# Patient Record
Sex: Male | Born: 1950 | Race: Black or African American | Hispanic: No | Marital: Married | Smoking: Never smoker
Health system: Southern US, Community
[De-identification: ages and names within clinical notes are randomized; demographics above are authoritative.]

## PROBLEM LIST (undated history)

## (undated) DIAGNOSIS — M5030 Other cervical disc degeneration, unspecified cervical region: Secondary | ICD-10-CM

## (undated) DIAGNOSIS — I10 Essential (primary) hypertension: Secondary | ICD-10-CM

## (undated) DIAGNOSIS — M5432 Sciatica, left side: Secondary | ICD-10-CM

## (undated) DIAGNOSIS — Z800 Family history of malignant neoplasm of digestive organs: Secondary | ICD-10-CM

## (undated) DIAGNOSIS — N281 Cyst of kidney, acquired: Secondary | ICD-10-CM

## (undated) DIAGNOSIS — E785 Hyperlipidemia, unspecified: Secondary | ICD-10-CM

## (undated) DIAGNOSIS — Z86010 Personal history of colonic polyps: Secondary | ICD-10-CM

## (undated) DIAGNOSIS — Z8042 Family history of malignant neoplasm of prostate: Secondary | ICD-10-CM

## (undated) DIAGNOSIS — J301 Allergic rhinitis due to pollen: Secondary | ICD-10-CM

## (undated) DIAGNOSIS — K635 Polyp of colon: Secondary | ICD-10-CM

## (undated) DIAGNOSIS — I700 Atherosclerosis of aorta: Secondary | ICD-10-CM

## (undated) DIAGNOSIS — R202 Paresthesia of skin: Secondary | ICD-10-CM

## (undated) HISTORY — DX: Polyp of colon: K63.5

## (undated) HISTORY — DX: Family history of malignant neoplasm of digestive organs: Z80.0

## (undated) HISTORY — DX: Sciatica, left side: M54.32

## (undated) HISTORY — DX: Other cervical disc degeneration, unspecified cervical region: M50.30

## (undated) HISTORY — DX: Atherosclerosis of aorta: I70.0

## (undated) HISTORY — DX: Personal history of colonic polyps: Z86.010

## (undated) HISTORY — DX: Cyst of kidney, acquired: N28.1

## (undated) HISTORY — DX: Family history of malignant neoplasm of prostate: Z80.42

## (undated) HISTORY — PX: KNEE ARTHROSCOPY: SHX127

## (undated) HISTORY — DX: Allergic rhinitis due to pollen: J30.1

## (undated) HISTORY — DX: Hypercalcemia: E83.52

## (undated) HISTORY — DX: Paresthesia of skin: R20.2

---

## 2005-07-30 ENCOUNTER — Ambulatory Visit (HOSPITAL_COMMUNITY): Admission: RE | Admit: 2005-07-30 | Discharge: 2005-07-30 | Payer: Self-pay | Admitting: Orthopedic Surgery

## 2005-07-30 ENCOUNTER — Ambulatory Visit (HOSPITAL_BASED_OUTPATIENT_CLINIC_OR_DEPARTMENT_OTHER): Admission: RE | Admit: 2005-07-30 | Discharge: 2005-07-30 | Payer: Self-pay | Admitting: Orthopedic Surgery

## 2011-11-28 ENCOUNTER — Emergency Department (INDEPENDENT_AMBULATORY_CARE_PROVIDER_SITE_OTHER)
Admission: EM | Admit: 2011-11-28 | Discharge: 2011-11-28 | Disposition: A | Discharge status: Home / Self Care | Payer: Medicare Other | Source: Home / Self Care | Attending: Family Medicine | Admitting: Family Medicine

## 2011-11-28 ENCOUNTER — Encounter: Payer: Self-pay | Admitting: Cardiology

## 2011-11-28 DIAGNOSIS — J0100 Acute maxillary sinusitis, unspecified: Secondary | ICD-10-CM

## 2011-11-28 HISTORY — DX: Essential (primary) hypertension: I10

## 2011-11-28 MED ORDER — AMOXICILLIN-POT CLAVULANATE 875-125 MG PO TABS: 1.0000 | ORAL_TABLET | Freq: Two times a day (BID) | ORAL | Status: AC

## 2011-11-28 MED ORDER — IPRATROPIUM BROMIDE 0.06 % NA SOLN: 2.0000 | Freq: Four times a day (QID) | NASAL | Status: DC

## 2011-11-28 NOTE — ED Notes (Addendum)
Pt reports last 2-3 days of sinus pressure and face pain. Cough at night coughing up blood tinged sputum. Pt has cold like symptom for 6 days prior. Denies fever. Unable to sleep at night with the symptoms. Has tried tylenol sinus and coricidin for cold like symptoms no longer getting relief.

## 2011-11-28 NOTE — ED Provider Notes (Signed)
History     CSN: [id number] Arrival date & time: 11/28/2011 10:23 AM   First MD Initiated Contact with Patient 11/28/11 1013      Chief Complaint  Patient presents with  . Sinusitis  . Facial Pain  . Cough    (Consider location/radiation/quality/duration/timing/severity/associated sxs/prior treatment) Patient is a 60 y.o. male presenting with sinusitis and cough. The history is provided by the patient.  Sinusitis  This is a new problem. Episode onset: uri cold last week , now 2-3 days of facial pressure and tenderness., drainage, no fever. The problem has been gradually worsening. There has been no fever. The pain is mild. Associated symptoms include congestion, sinus pressure and cough. He has tried nothing for the symptoms.  Cough Associated symptoms include rhinorrhea.    Past Medical History  Diagnosis Date  . Hypertension     History reviewed. No pertinent past surgical history.  Family History  Problem Relation Age of Onset  . Hypertension Other     History  Substance Use Topics  . Smoking status: Never Smoker   . Smokeless tobacco: Not on file  . Alcohol Use: Yes     1 drink daily      Review of Systems  Constitutional: Negative.   HENT: Positive for nosebleeds, congestion, rhinorrhea, postnasal drip and sinus pressure.   Respiratory: Positive for cough.     Allergies  Food  Home Medications   Current Outpatient Rx  Name Route Sig Dispense Refill  . ATORVASTATIN CALCIUM 10 MG PO TABS Oral Take 10 mg by mouth daily.      Marland Kitchen PROPECIA PO Oral Take by mouth daily.      Marland Kitchen LISINOPRIL 10 MG PO TABS Oral Take 10 mg by mouth daily.      . AMOXICILLIN-POT CLAVULANATE 875-125 MG PO TABS Oral Take 1 tablet by mouth 2 (two) times daily. 20 tablet 0  . IPRATROPIUM BROMIDE 0.06 % NA SOLN Nasal Place 2 sprays into the nose 4 (four) times daily. 15 mL 12    BP 127/85  Pulse 94  Temp(Src) 98.1 F (36.7 C) (Oral)  Resp 18  SpO2 98%  Physical Exam  Nursing  note and vitals reviewed. Constitutional: He appears well-developed and well-nourished.  HENT:  Head: Normocephalic.  Right Ear: External ear normal.  Left Ear: External ear normal.  Nose: Mucosal edema and rhinorrhea present. Right sinus exhibits maxillary sinus tenderness. Left sinus exhibits maxillary sinus tenderness.  Mouth/Throat: Oropharynx is clear and moist.  Eyes: Conjunctivae and EOM are normal. Pupils are equal, round, and reactive to light.  Neck: Normal range of motion. Neck supple.  Cardiovascular: Normal rate, normal heart sounds and intact distal pulses.   Pulmonary/Chest: Effort normal.  Skin: Skin is warm and dry.    ED Course  Procedures (including critical care time)  Labs Reviewed - No data to display No results found.   1. Sinusitis, acute maxillary       MDM          Barkley Bruns, MD 11/28/11 1048

## 2015-02-09 ENCOUNTER — Emergency Department (HOSPITAL_COMMUNITY)
Admission: EM | Admit: 2015-02-09 | Discharge: 2015-02-09 | Disposition: A | Discharge status: Home / Self Care | Payer: BLUE CROSS/BLUE SHIELD | Attending: Emergency Medicine | Admitting: Emergency Medicine

## 2015-02-09 ENCOUNTER — Encounter (HOSPITAL_COMMUNITY): Payer: Self-pay

## 2015-02-09 DIAGNOSIS — Y998 Other external cause status: Secondary | ICD-10-CM | POA: Diagnosis not present

## 2015-02-09 DIAGNOSIS — R55 Syncope and collapse: Secondary | ICD-10-CM | POA: Insufficient documentation

## 2015-02-09 DIAGNOSIS — E785 Hyperlipidemia, unspecified: Secondary | ICD-10-CM | POA: Diagnosis not present

## 2015-02-09 DIAGNOSIS — R197 Diarrhea, unspecified: Secondary | ICD-10-CM

## 2015-02-09 DIAGNOSIS — Y9389 Activity, other specified: Secondary | ICD-10-CM | POA: Insufficient documentation

## 2015-02-09 DIAGNOSIS — S0081XA Abrasion of other part of head, initial encounter: Secondary | ICD-10-CM | POA: Diagnosis not present

## 2015-02-09 DIAGNOSIS — R109 Unspecified abdominal pain: Secondary | ICD-10-CM | POA: Diagnosis not present

## 2015-02-09 DIAGNOSIS — Z79899 Other long term (current) drug therapy: Secondary | ICD-10-CM | POA: Diagnosis not present

## 2015-02-09 DIAGNOSIS — Y92092 Bedroom in other non-institutional residence as the place of occurrence of the external cause: Secondary | ICD-10-CM | POA: Insufficient documentation

## 2015-02-09 DIAGNOSIS — W228XXA Striking against or struck by other objects, initial encounter: Secondary | ICD-10-CM | POA: Insufficient documentation

## 2015-02-09 DIAGNOSIS — R112 Nausea with vomiting, unspecified: Secondary | ICD-10-CM | POA: Diagnosis not present

## 2015-02-09 DIAGNOSIS — I10 Essential (primary) hypertension: Secondary | ICD-10-CM | POA: Insufficient documentation

## 2015-02-09 HISTORY — DX: Hyperlipidemia, unspecified: E78.5

## 2015-02-09 LAB — COMPREHENSIVE METABOLIC PANEL
ALK PHOS: 64 U/L (ref 39–117)
ALT: 23 U/L (ref 0–53)
ANION GAP: 3 — AB (ref 5–15)
AST: 28 U/L (ref 0–37)
Albumin: 4.2 g/dL (ref 3.5–5.2)
BUN: 11 mg/dL (ref 6–23)
CO2: 28 mmol/L (ref 19–32)
CREATININE: 0.95 mg/dL (ref 0.50–1.35)
Calcium: 9.2 mg/dL (ref 8.4–10.5)
Chloride: 107 mmol/L (ref 96–112)
GFR calc Af Amer: >90 mL/min (ref >90)
GFR calc non Af Amer: 86 mL/min — ABNORMAL LOW (ref >90)
Glucose, Bld: 120 mg/dL — ABNORMAL HIGH (ref 70–99)
POTASSIUM: 3.8 mmol/L (ref 3.5–5.1)
SODIUM: 138 mmol/L (ref 135–145)
Total Bilirubin: 0.6 mg/dL (ref 0.3–1.2)
Total Protein: 7.2 g/dL (ref 6.0–8.3)

## 2015-02-09 LAB — CBG MONITORING, ED: GLUCOSE-CAPILLARY: 107 mg/dL — AB (ref 70–99)

## 2015-02-09 LAB — CBC WITH DIFFERENTIAL/PLATELET
BASOS PCT: 0 % (ref 0–1)
Basophils Absolute: 0 10*3/uL (ref 0.0–0.1)
EOS ABS: 0 10*3/uL (ref 0.0–0.7)
EOS PCT: 0 % (ref 0–5)
HEMATOCRIT: 42.8 % (ref 39.0–52.0)
Hemoglobin: 14.7 g/dL (ref 13.0–17.0)
LYMPHS ABS: 0.6 10*3/uL — AB (ref 0.7–4.0)
Lymphocytes Relative: 6 % — ABNORMAL LOW (ref 12–46)
MCH: 31 pg (ref 26.0–34.0)
MCHC: 34.3 g/dL (ref 30.0–36.0)
MCV: 90.3 fL (ref 78.0–100.0)
Monocytes Absolute: 0.5 10*3/uL (ref 0.1–1.0)
Monocytes Relative: 5 % (ref 3–12)
Neutro Abs: 9 10*3/uL — ABNORMAL HIGH (ref 1.7–7.7)
Neutrophils Relative %: 89 % — ABNORMAL HIGH (ref 43–77)
PLATELETS: 208 10*3/uL (ref 150–400)
RBC: 4.74 MIL/uL (ref 4.22–5.81)
RDW: 14.1 % (ref 11.5–15.5)
WBC: 10.2 10*3/uL (ref 4.0–10.5)

## 2015-02-09 LAB — URINALYSIS, ROUTINE W REFLEX MICROSCOPIC
BILIRUBIN URINE: NEGATIVE
GLUCOSE, UA: NEGATIVE mg/dL
HGB URINE DIPSTICK: NEGATIVE
Ketones, ur: NEGATIVE mg/dL
Leukocytes, UA: NEGATIVE
NITRITE: NEGATIVE
Protein, ur: NEGATIVE mg/dL
Specific Gravity, Urine: 1.025 (ref 1.005–1.030)
UROBILINOGEN UA: 0.2 mg/dL (ref 0.0–1.0)
pH: 5.5 (ref 5.0–8.0)

## 2015-02-09 LAB — LIPASE, BLOOD: LIPASE: 22 U/L (ref 11–59)

## 2015-02-09 LAB — I-STAT TROPONIN, ED: Troponin i, poc: 0 ng/mL (ref 0.00–0.08)

## 2015-02-09 MED ORDER — ONDANSETRON HCL 4 MG PO TABS: 4.0000 mg | ORAL_TABLET | Freq: Four times a day (QID) | ORAL | Status: DC

## 2015-02-09 MED ORDER — SODIUM CHLORIDE 0.9 % IV BOLUS (SEPSIS): 1000.0000 mL | Freq: Once | INTRAVENOUS | Status: DC

## 2015-02-09 MED ORDER — ONDANSETRON HCL 4 MG/2ML IJ SOLN
4.0000 mg | INTRAMUSCULAR | Status: AC
  Administered 2015-02-09: 4 mg via INTRAVENOUS
  Filled 2015-02-09: qty 2

## 2015-02-09 MED ORDER — SODIUM CHLORIDE 0.9 % IV BOLUS (SEPSIS)
1000.0000 mL | INTRAVENOUS | Status: AC
  Administered 2015-02-09: 1000 mL via INTRAVENOUS

## 2015-02-09 NOTE — ED Provider Notes (Signed)
CSN: [id number]     Arrival date & time 02/09/15  0605 History   First MD Initiated Contact with Patient 02/09/15 [phone]     Chief Complaint  Patient presents with  . Loss of Consciousness   (Consider location/radiation/quality/duration/timing/severity/associated sxs/prior Treatment) HPI   Gerald Lynch is a 64 yo male presenting with report of abd pain, diarrhea and syncopal episode.  He states he and his wife had dinner of salmon salads around 5 pm last night.  Two hours later he states he felt some abd bloating and felt hot and cold and to lay down.  He began having loose brown stools followed by feeling cold and clammy and light-headed. His wife reports he called for her and he was sitting on the floor of the bedroom.  She was helping him to the bed and he became weak and light-headed and did not respond to her for several seconds but not longer than a minute.  He then resumed his baseline mental status and she assisted him to the bathroom to have another bowel movement.  While assisting him back, he became light-headed and sweaty again and passed out into the bathroom wall, hitting his forehead.  He quickly resumed baseline mental status and laid down in the bed.  He recalls three episodes of diarrhea and one episode of vomiting. He denies any chest pain or shortness of breath at any time.  He denies any focal numbness, weakness or blurred vison at any time. He denies bloody or bilious emesis or any tarry or bloody stools.      Past Medical History  Diagnosis Date  . Hypertension   . Hyperlipidemia    Past Surgical History  Procedure Laterality Date  . Knee arthroscopy     Family History  Problem Relation Age of Onset  . Hypertension Other    History  Substance Use Topics  . Smoking status: Never Smoker   . Smokeless tobacco: Not on file  . Alcohol Use: Yes     Comment: 1 drink daily    Review of Systems  Constitutional: Negative for fever and chills.  HENT: Negative for sore  throat.   Eyes: Negative for visual disturbance.  Respiratory: Negative for cough and shortness of breath.   Cardiovascular: Negative for chest pain and leg swelling.  Gastrointestinal: Positive for nausea, vomiting, abdominal pain and diarrhea. Negative for blood in stool.  Genitourinary: Negative for dysuria.  Musculoskeletal: Negative for myalgias.  Skin: Negative for rash.  Neurological: Negative for weakness, numbness and headaches.    Allergies  Food  Home Medications   Prior to Admission medications   Medication Sig Start Date End Date Taking? Authorizing Provider  atorvastatin (LIPITOR) 10 MG tablet Take 10 mg by mouth daily.      Historical Provider, MD  Finasteride (PROPECIA PO) Take by mouth daily.      Historical Provider, MD  ipratropium (ATROVENT) 0.06 % nasal spray Place 2 sprays into the nose 4 (four) times daily. 11/28/11 11/27/12  Billy Fischer, MD  lisinopril (PRINIVIL,ZESTRIL) 10 MG tablet Take 10 mg by mouth daily.      Historical Provider, MD   BP 142/93 mmHg  Pulse 94  Temp(Src) 98.3 F (36.8 C) (Oral)  Resp 15  Ht 6' (1.829 m)  Wt 193 lb (87.544 kg)  BMI 26.17 kg/m2  SpO2 99% Physical Exam  Constitutional: He is oriented to person, place, and time. He appears well-developed and well-nourished. No distress.  HENT:  Head: Normocephalic and  atraumatic.  Mouth/Throat: Oropharynx is clear and moist.  Superficial abrasion, to left forehead, no redness, swelling, or ecchymosis noted.   Eyes: Conjunctivae are normal. Pupils are equal, round, and reactive to light.  Neck: Neck supple.  Cardiovascular: Normal rate, regular rhythm and intact distal pulses.   Pulmonary/Chest: Effort normal and breath sounds normal. No respiratory distress. He has no wheezes. He has no rales. He exhibits no tenderness.  Abdominal: Soft. He exhibits no distension and no mass. There is no hepatosplenomegaly. There is no tenderness. There is no rigidity, no rebound, no guarding, no CVA  tenderness, no tenderness at McBurney's point and negative Murphy's sign.  Musculoskeletal: He exhibits no tenderness.  Lymphadenopathy:    He has no cervical adenopathy.  Neurological: He is alert and oriented to person, place, and time. He has normal strength. No cranial nerve deficit or sensory deficit. GCS eye subscore is 4. GCS verbal subscore is 5. GCS motor subscore is 6.  Cranial nerves 2-12 intact.   Skin: Skin is warm and dry. No rash noted. He is not diaphoretic.  Psychiatric: He has a normal mood and affect.  Nursing note and vitals reviewed.   ED Course  Procedures (including critical care time) Labs Review Labs Reviewed  CBC WITH DIFFERENTIAL/PLATELET - Abnormal; Notable for the following:    Neutrophils Relative % 89 (*)    Neutro Abs 9.0 (*)    Lymphocytes Relative 6 (*)    Lymphs Abs 0.6 (*)    All other components within normal limits  COMPREHENSIVE METABOLIC PANEL - Abnormal; Notable for the following:    Glucose, Bld 120 (*)    GFR calc non Af Amer 86 (*)    Anion gap 3 (*)    All other components within normal limits  CBG MONITORING, ED - Abnormal; Notable for the following:    Glucose-Capillary 107 (*)    All other components within normal limits  LIPASE, BLOOD  URINALYSIS, ROUTINE W REFLEX MICROSCOPIC  I-STAT TROPOININ, ED    Imaging Review No results found.   EKG Interpretation   Date/Time:  Sunday February 09 2015 06:09:43 EST Ventricular Rate:  94 PR Interval:  195 QRS Duration: 94 QT Interval:  348 QTC Calculation: 435 R Axis:   21 Text Interpretation:  Sinus rhythm Artifact No significant change since  last tracing Confirmed by WARD,  DO, KRISTEN ([id number]) on 02/09/2015 6:27:35  AM      MDM   Final diagnoses:  Vasovagal syncope  Nausea, vomiting and diarrhea   64 yo male presenting with abd discomfort, n/v/d and syncopal episode.  Consider viral gastroenteritis vs related to recent food intake.  No indication for cardiac or  significant neuro etiology for syncopal episodes.  Discussed case with Dr. Leonides Schanz.  NS bolus, CBC, CMP, Lipase, Troponin, EKG.  Re-check orthostatics showed minmal changes in heart rate and blood pressure with position change.  He was able to ambulate in ED and tolerate PO fluids.  Discussed oral rehydration at home and symptom management.  Pt is well-appearing, in no acute distress and vital signs reviewed and not concerning.  He appears safe to be discharged.  Discharge include follow-up with their PCP.  Return precautions provided. Pt aware of plan and in agreement.       Filed Vitals:   02/09/15 0612 02/09/15 0630 02/09/15 0645 02/09/15 0807  BP: 142/93 133/81 142/97 132/60  Pulse: 94 91 89 98  Temp: 98.3 F (36.8 C)     TempSrc: Oral  Resp: 15 20 20 16   Height: 6' (1.829 m)     Weight: 193 lb (87.544 kg)     SpO2: 99% 99% 100% 100%   Meds given in ED:  Medications  sodium chloride 0.9 % bolus 1,000 mL (0 mLs Intravenous Stopped 02/09/15 0719)  ondansetron (ZOFRAN) injection 4 mg (4 mg Intravenous Given 02/09/15 0641)  sodium chloride 0.9 % bolus 1,000 mL (0 mLs Intravenous Stopped 02/09/15 0806)    New Prescriptions   ONDANSETRON (ZOFRAN) 4 MG TABLET    Take 1 tablet (4 mg total) by mouth every 6 (six) hours.       Britt Bottom, NP 02/09/15 [phone]

## 2015-02-09 NOTE — ED Notes (Signed)
Pt denies chest pain at this time   Orthostatic vital signs per [facility]:  BP 152/100 HR 100  - lying BP 144/100 HR 100 - sitting  BP 130/92 HR 110 - standing

## 2015-02-09 NOTE — ED Notes (Signed)
Ambulated in hall with no assist--- denies dizziness, no diaphoresis.

## 2015-02-09 NOTE — ED Notes (Signed)
Yellow socks and falls band applied to pt.  

## 2015-02-09 NOTE — ED Notes (Signed)
Per [facility]: pt had salmon last night, pts wife had abdominal cramping, and the pt had diarrhea and 3 full syncopal episodes.

## 2015-02-09 NOTE — Discharge Instructions (Signed)
Please follow the directions provided.  Be sure to follow-up with your primary care provider to ensure you are getting better.  Continue to drink plenty of fluids by mouth.  You may take the zofran, if needed, for nausea to help you tolerate fluids.  You may progress to your normal diet as you can tolerate.  Your labs are all reassuring and these symptoms should resolve in 24 hours -48 hours. Don't hesitate to return for any new, worsening or concerning symptoms.      SEEK IMMEDIATE MEDICAL CARE IF:  You are unable to keep fluids down.  You have persistent vomiting.  You have blood in your stool, or your stools are black and tarry.  You do not urinate in 6-8 hours, or there is only a small amount of very dark urine.  You have abdominal pain that increases or localizes.  You have weakness, dizziness, confusion, or light-headedness.  You have a severe headache.  Your diarrhea gets worse or does not get better.  You have a fever or persistent symptoms for more than 2-3 days.  You have a fever and your symptoms suddenly get worse.

## 2015-02-09 NOTE — ED Provider Notes (Signed)
Medical screening examination/treatment/procedure(s) were conducted as a shared visit with non-physician practitioner(s) and myself.  I personally evaluated the patient during the encounter.   EKG Interpretation   Date/Time:  Sunday February 09 2015 06:09:43 EST Ventricular Rate:  94 PR Interval:  195 QRS Duration: 94 QT Interval:  348 QTC Calculation: 435 R Axis:   21 Text Interpretation:  Sinus rhythm Artifact No significant change since  last tracing Confirmed by Viliami Bracco,  DO, Isaak Delmundo [phone]) on 02/09/2015 6:27:35  AM      Pt is a 64 y.o. male with history of hypertension and hyperlipidemia who had 3 syncopal events at home with vomiting, diarrhea. Both patient and wife report eating salmon for dinner and both of them did not feel well afterwards. Wife did not have any vomiting or diarrhea the patient has had one episode of vomiting and multiple episodes of nonbloody diarrhea. No abdominal pain on exam. Denies chest pain or shortness of breath or palpitations. No headache, numbness, tingling or focal weakness. States he did hit his head against a wall but is not on anticoagulation and is currently neurologically intact. He is tachycardic with standing and become symptomatic. Suspect episodes of syncope related to GI symptoms. GI symptoms likely caused by viral gastroenteritis versus bad food exposure. Will continue IV hydration. We'll check basic labs and troponin. If workup unremarkable and he is feeling better, tolerating by mouth we'll discharge home.   [place], DO 02/09/15 [phone]

## 2015-02-09 NOTE — ED Notes (Addendum)
Pt has had 3 syncopal episodes 1st episode - unwitnessed, wife found pt on floor in bedroom.  2nd episode - wife helped the pt to the floor - pt did not hit head - took the pt less than 1 minute to become conscious  3rd episode - pt hit head on the wall - less than 1 minute to regain consciousness  Pt took imodium at home around 0300  Pt took tylenol at home around 0400   pts wife reports that the pt felt hot to the touch and would then become clammy and sweaty, she believes that the pt had a fever but was unable to take the pts temperature.

## 2015-02-09 NOTE — ED Notes (Signed)
Blood sugar 107

## 2015-04-08 ENCOUNTER — Encounter: Payer: Self-pay | Admitting: Podiatry

## 2015-04-08 ENCOUNTER — Ambulatory Visit (INDEPENDENT_AMBULATORY_CARE_PROVIDER_SITE_OTHER): Payer: BLUE CROSS/BLUE SHIELD | Admitting: Podiatry

## 2015-04-08 ENCOUNTER — Ambulatory Visit (INDEPENDENT_AMBULATORY_CARE_PROVIDER_SITE_OTHER): Payer: BLUE CROSS/BLUE SHIELD

## 2015-04-08 VITALS — BP 127/75 | HR 70 | Resp 16 | Ht 72.0 in | Wt 187.0 lb

## 2015-04-08 DIAGNOSIS — M2011 Hallux valgus (acquired), right foot: Secondary | ICD-10-CM

## 2015-04-08 DIAGNOSIS — M205X1 Other deformities of toe(s) (acquired), right foot: Secondary | ICD-10-CM | POA: Diagnosis not present

## 2015-04-08 NOTE — Progress Notes (Signed)
   Subjective:    Patient ID: Gerald Lynch, male    DOB: 1951-04-08, 64 y.o.   MRN: [id number]  HPI Comments: "I have a bunion I want checked"  Patient c/o aching 1st MPJ right for several years. Getting worse. Gets red and swollen occasionally. Takes Advil as needed.     Review of Systems  Allergic/Immunologic: Positive for food allergies.  All other systems reviewed and are negative.      Objective:   Physical Exam: I have reviewed his past medical history medications allergies surgery social history and review of systems. Pulses are strongly palpable bilateral. Deep tendon reflexes are intact bilaterally neurologic sensorium is intact. Muscle strength +5 over 5 dorsiflexion plantar flexors and inverters everters all intrinsic musculature is intact. Orthopedic evaluation does demonstrate mild pes planus hallux abductovalgus deformity with hallux limitus first metatarsophalangeal joint right greater than left. Radiographic evaluation confirms this. No signs of severe osteoarthritic changes.        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  Plan: Discussed the etiology pathology conservative versus surgical therapies. At this point time he will follow up with [place] on an as-needed basis for surgical intervention.

## 2015-09-24 DIAGNOSIS — H43391 Other vitreous opacities, right eye: Secondary | ICD-10-CM | POA: Diagnosis not present

## 2015-09-26 DIAGNOSIS — E7800 Pure hypercholesterolemia, unspecified: Secondary | ICD-10-CM | POA: Diagnosis not present

## 2015-09-26 DIAGNOSIS — Z125 Encounter for screening for malignant neoplasm of prostate: Secondary | ICD-10-CM | POA: Diagnosis not present

## 2015-09-26 DIAGNOSIS — Z1389 Encounter for screening for other disorder: Secondary | ICD-10-CM | POA: Diagnosis not present

## 2015-09-26 DIAGNOSIS — Z23 Encounter for immunization: Secondary | ICD-10-CM | POA: Diagnosis not present

## 2015-09-26 DIAGNOSIS — I10 Essential (primary) hypertension: Secondary | ICD-10-CM | POA: Diagnosis not present

## 2015-11-19 DIAGNOSIS — H43811 Vitreous degeneration, right eye: Secondary | ICD-10-CM | POA: Diagnosis not present

## 2015-11-19 DIAGNOSIS — H2513 Age-related nuclear cataract, bilateral: Secondary | ICD-10-CM | POA: Diagnosis not present

## 2016-03-22 DIAGNOSIS — H43811 Vitreous degeneration, right eye: Secondary | ICD-10-CM | POA: Diagnosis not present

## 2016-03-22 DIAGNOSIS — H2513 Age-related nuclear cataract, bilateral: Secondary | ICD-10-CM | POA: Diagnosis not present

## 2016-03-26 DIAGNOSIS — E876 Hypokalemia: Secondary | ICD-10-CM | POA: Diagnosis not present

## 2016-03-26 DIAGNOSIS — I10 Essential (primary) hypertension: Secondary | ICD-10-CM | POA: Diagnosis not present

## 2016-03-26 DIAGNOSIS — Z23 Encounter for immunization: Secondary | ICD-10-CM | POA: Diagnosis not present

## 2016-05-19 DIAGNOSIS — H43811 Vitreous degeneration, right eye: Secondary | ICD-10-CM | POA: Diagnosis not present

## 2016-05-19 DIAGNOSIS — H2513 Age-related nuclear cataract, bilateral: Secondary | ICD-10-CM | POA: Diagnosis not present

## 2016-08-26 DIAGNOSIS — L720 Epidermal cyst: Secondary | ICD-10-CM | POA: Diagnosis not present

## 2016-08-26 DIAGNOSIS — D235 Other benign neoplasm of skin of trunk: Secondary | ICD-10-CM | POA: Diagnosis not present

## 2016-08-26 DIAGNOSIS — L821 Other seborrheic keratosis: Secondary | ICD-10-CM | POA: Diagnosis not present

## 2016-10-04 DIAGNOSIS — Z125 Encounter for screening for malignant neoplasm of prostate: Secondary | ICD-10-CM | POA: Diagnosis not present

## 2016-10-04 DIAGNOSIS — E7800 Pure hypercholesterolemia, unspecified: Secondary | ICD-10-CM | POA: Diagnosis not present

## 2016-10-04 DIAGNOSIS — I10 Essential (primary) hypertension: Secondary | ICD-10-CM | POA: Diagnosis not present

## 2016-10-04 DIAGNOSIS — Z0000 Encounter for general adult medical examination without abnormal findings: Secondary | ICD-10-CM | POA: Diagnosis not present

## 2016-10-04 DIAGNOSIS — Z1389 Encounter for screening for other disorder: Secondary | ICD-10-CM | POA: Diagnosis not present

## 2016-10-04 DIAGNOSIS — Z23 Encounter for immunization: Secondary | ICD-10-CM | POA: Diagnosis not present

## 2017-04-05 DIAGNOSIS — I10 Essential (primary) hypertension: Secondary | ICD-10-CM | POA: Diagnosis not present

## 2017-04-05 DIAGNOSIS — K635 Polyp of colon: Secondary | ICD-10-CM | POA: Diagnosis not present

## 2017-04-18 DIAGNOSIS — Z86010 Personal history of colonic polyps: Secondary | ICD-10-CM | POA: Diagnosis not present

## 2017-06-06 DIAGNOSIS — K641 Second degree hemorrhoids: Secondary | ICD-10-CM | POA: Diagnosis not present

## 2017-06-06 DIAGNOSIS — Z1211 Encounter for screening for malignant neoplasm of colon: Secondary | ICD-10-CM | POA: Diagnosis not present

## 2017-06-06 DIAGNOSIS — D126 Benign neoplasm of colon, unspecified: Secondary | ICD-10-CM | POA: Diagnosis not present

## 2017-06-06 DIAGNOSIS — K635 Polyp of colon: Secondary | ICD-10-CM | POA: Diagnosis not present

## 2017-06-09 DIAGNOSIS — D126 Benign neoplasm of colon, unspecified: Secondary | ICD-10-CM | POA: Diagnosis not present

## 2017-06-09 DIAGNOSIS — Z1211 Encounter for screening for malignant neoplasm of colon: Secondary | ICD-10-CM | POA: Diagnosis not present

## 2017-06-09 DIAGNOSIS — K635 Polyp of colon: Secondary | ICD-10-CM | POA: Diagnosis not present

## 2017-06-27 DIAGNOSIS — H2513 Age-related nuclear cataract, bilateral: Secondary | ICD-10-CM | POA: Diagnosis not present

## 2017-08-04 DIAGNOSIS — D225 Melanocytic nevi of trunk: Secondary | ICD-10-CM | POA: Diagnosis not present

## 2017-08-04 DIAGNOSIS — L708 Other acne: Secondary | ICD-10-CM | POA: Diagnosis not present

## 2017-08-04 DIAGNOSIS — L821 Other seborrheic keratosis: Secondary | ICD-10-CM | POA: Diagnosis not present

## 2017-09-21 DIAGNOSIS — Z23 Encounter for immunization: Secondary | ICD-10-CM | POA: Diagnosis not present

## 2017-11-14 DIAGNOSIS — Z125 Encounter for screening for malignant neoplasm of prostate: Secondary | ICD-10-CM | POA: Diagnosis not present

## 2017-11-14 DIAGNOSIS — Z1389 Encounter for screening for other disorder: Secondary | ICD-10-CM | POA: Diagnosis not present

## 2017-11-14 DIAGNOSIS — E7800 Pure hypercholesterolemia, unspecified: Secondary | ICD-10-CM | POA: Diagnosis not present

## 2017-11-14 DIAGNOSIS — Z0000 Encounter for general adult medical examination without abnormal findings: Secondary | ICD-10-CM | POA: Diagnosis not present

## 2017-11-14 DIAGNOSIS — I10 Essential (primary) hypertension: Secondary | ICD-10-CM | POA: Diagnosis not present

## 2017-12-27 DIAGNOSIS — I10 Essential (primary) hypertension: Secondary | ICD-10-CM | POA: Diagnosis not present

## 2018-02-02 DIAGNOSIS — H10503 Unspecified blepharoconjunctivitis, bilateral: Secondary | ICD-10-CM | POA: Diagnosis not present

## 2018-02-21 DIAGNOSIS — H04123 Dry eye syndrome of bilateral lacrimal glands: Secondary | ICD-10-CM | POA: Diagnosis not present

## 2018-02-21 DIAGNOSIS — H04203 Unspecified epiphora, bilateral lacrimal glands: Secondary | ICD-10-CM | POA: Diagnosis not present

## 2018-04-06 DIAGNOSIS — H04123 Dry eye syndrome of bilateral lacrimal glands: Secondary | ICD-10-CM | POA: Diagnosis not present

## 2018-05-11 DIAGNOSIS — I10 Essential (primary) hypertension: Secondary | ICD-10-CM | POA: Diagnosis not present

## 2018-05-11 DIAGNOSIS — M549 Dorsalgia, unspecified: Secondary | ICD-10-CM | POA: Diagnosis not present

## 2018-06-29 DIAGNOSIS — H2513 Age-related nuclear cataract, bilateral: Secondary | ICD-10-CM | POA: Diagnosis not present

## 2018-06-29 DIAGNOSIS — H04123 Dry eye syndrome of bilateral lacrimal glands: Secondary | ICD-10-CM | POA: Diagnosis not present

## 2018-08-18 DIAGNOSIS — I10 Essential (primary) hypertension: Secondary | ICD-10-CM | POA: Diagnosis not present

## 2018-08-18 DIAGNOSIS — H8111 Benign paroxysmal vertigo, right ear: Secondary | ICD-10-CM | POA: Diagnosis not present

## 2018-09-21 DIAGNOSIS — Z23 Encounter for immunization: Secondary | ICD-10-CM | POA: Diagnosis not present

## 2018-11-20 DIAGNOSIS — Z86010 Personal history of colonic polyps: Secondary | ICD-10-CM | POA: Diagnosis not present

## 2018-11-20 DIAGNOSIS — Z125 Encounter for screening for malignant neoplasm of prostate: Secondary | ICD-10-CM | POA: Diagnosis not present

## 2018-11-20 DIAGNOSIS — Z1389 Encounter for screening for other disorder: Secondary | ICD-10-CM | POA: Diagnosis not present

## 2018-11-20 DIAGNOSIS — Z0000 Encounter for general adult medical examination without abnormal findings: Secondary | ICD-10-CM | POA: Diagnosis not present

## 2018-11-20 DIAGNOSIS — I10 Essential (primary) hypertension: Secondary | ICD-10-CM | POA: Diagnosis not present

## 2018-11-20 DIAGNOSIS — E7800 Pure hypercholesterolemia, unspecified: Secondary | ICD-10-CM | POA: Diagnosis not present

## 2019-05-07 DIAGNOSIS — I10 Essential (primary) hypertension: Secondary | ICD-10-CM | POA: Diagnosis not present

## 2019-05-07 DIAGNOSIS — J301 Allergic rhinitis due to pollen: Secondary | ICD-10-CM | POA: Diagnosis not present

## 2019-09-01 DIAGNOSIS — Z23 Encounter for immunization: Secondary | ICD-10-CM | POA: Diagnosis not present

## 2019-11-27 DIAGNOSIS — Z125 Encounter for screening for malignant neoplasm of prostate: Secondary | ICD-10-CM | POA: Diagnosis not present

## 2019-11-27 DIAGNOSIS — Z1389 Encounter for screening for other disorder: Secondary | ICD-10-CM | POA: Diagnosis not present

## 2019-11-27 DIAGNOSIS — R05 Cough: Secondary | ICD-10-CM | POA: Diagnosis not present

## 2019-11-27 DIAGNOSIS — E7800 Pure hypercholesterolemia, unspecified: Secondary | ICD-10-CM | POA: Diagnosis not present

## 2019-11-27 DIAGNOSIS — I10 Essential (primary) hypertension: Secondary | ICD-10-CM | POA: Diagnosis not present

## 2019-11-27 DIAGNOSIS — Z0000 Encounter for general adult medical examination without abnormal findings: Secondary | ICD-10-CM | POA: Diagnosis not present

## 2020-02-05 IMAGING — CR DG SHOULDER 2+V*R*
3 series · 3 of 3 positions shown · non-contrast
Comparison: None.

CLINICAL DATA: Chronic right shoulder pain without known injury.

EXAM:
RIGHT SHOULDER - 2+ VIEW

[w shoulder ap internal righ *]
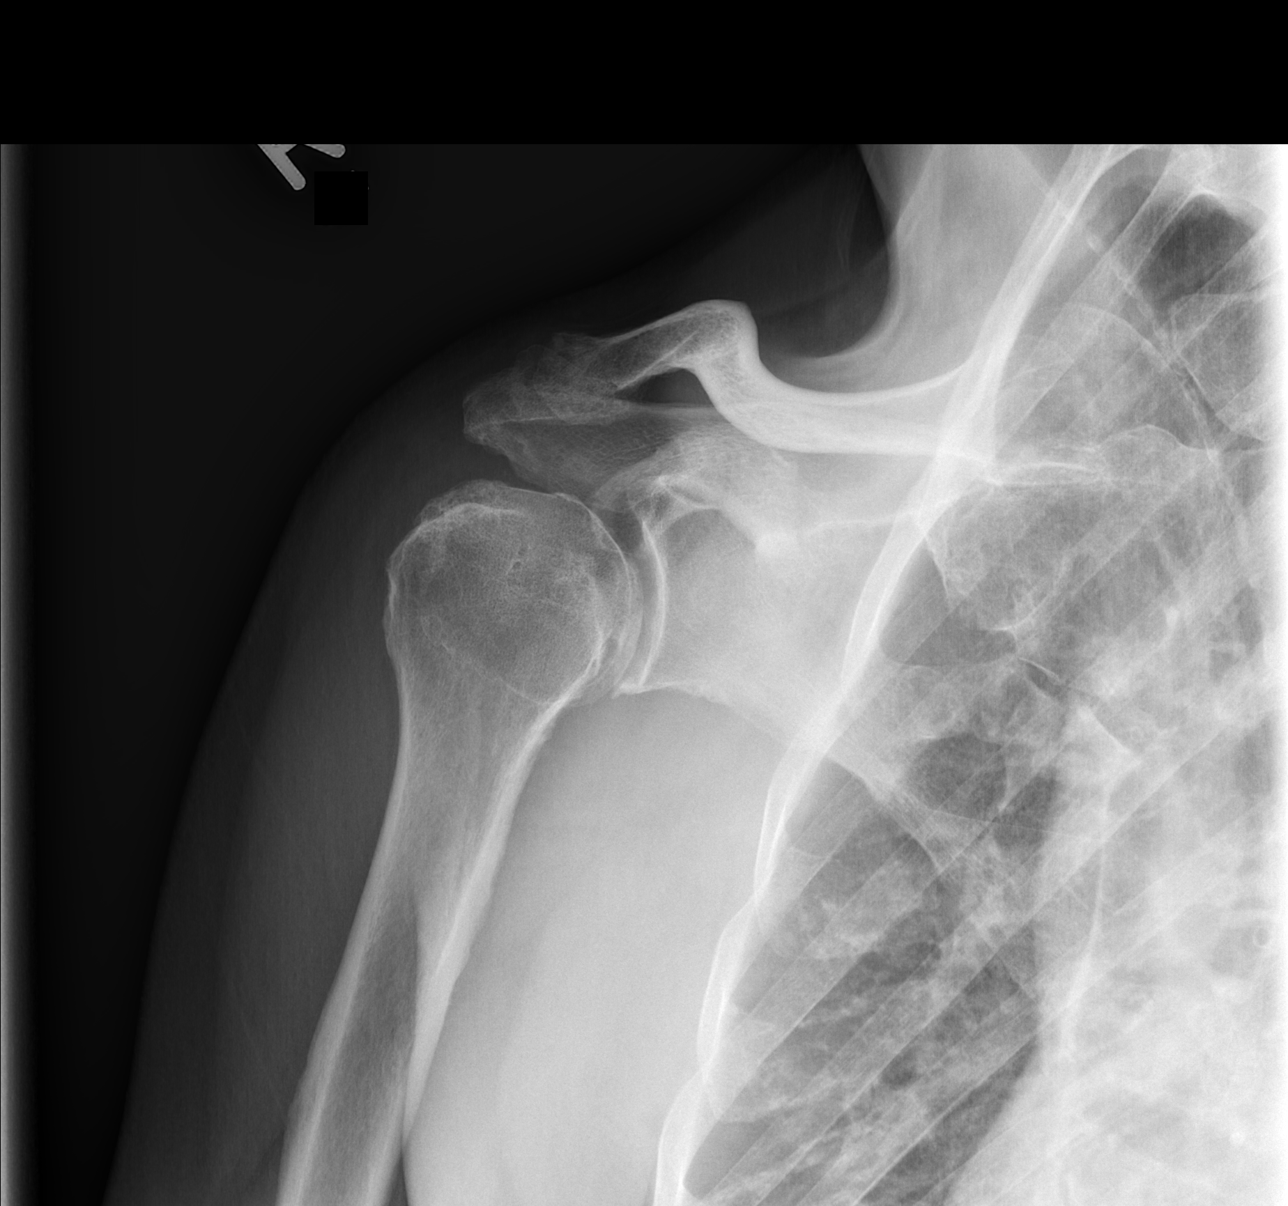

[w shoulder y view right *]
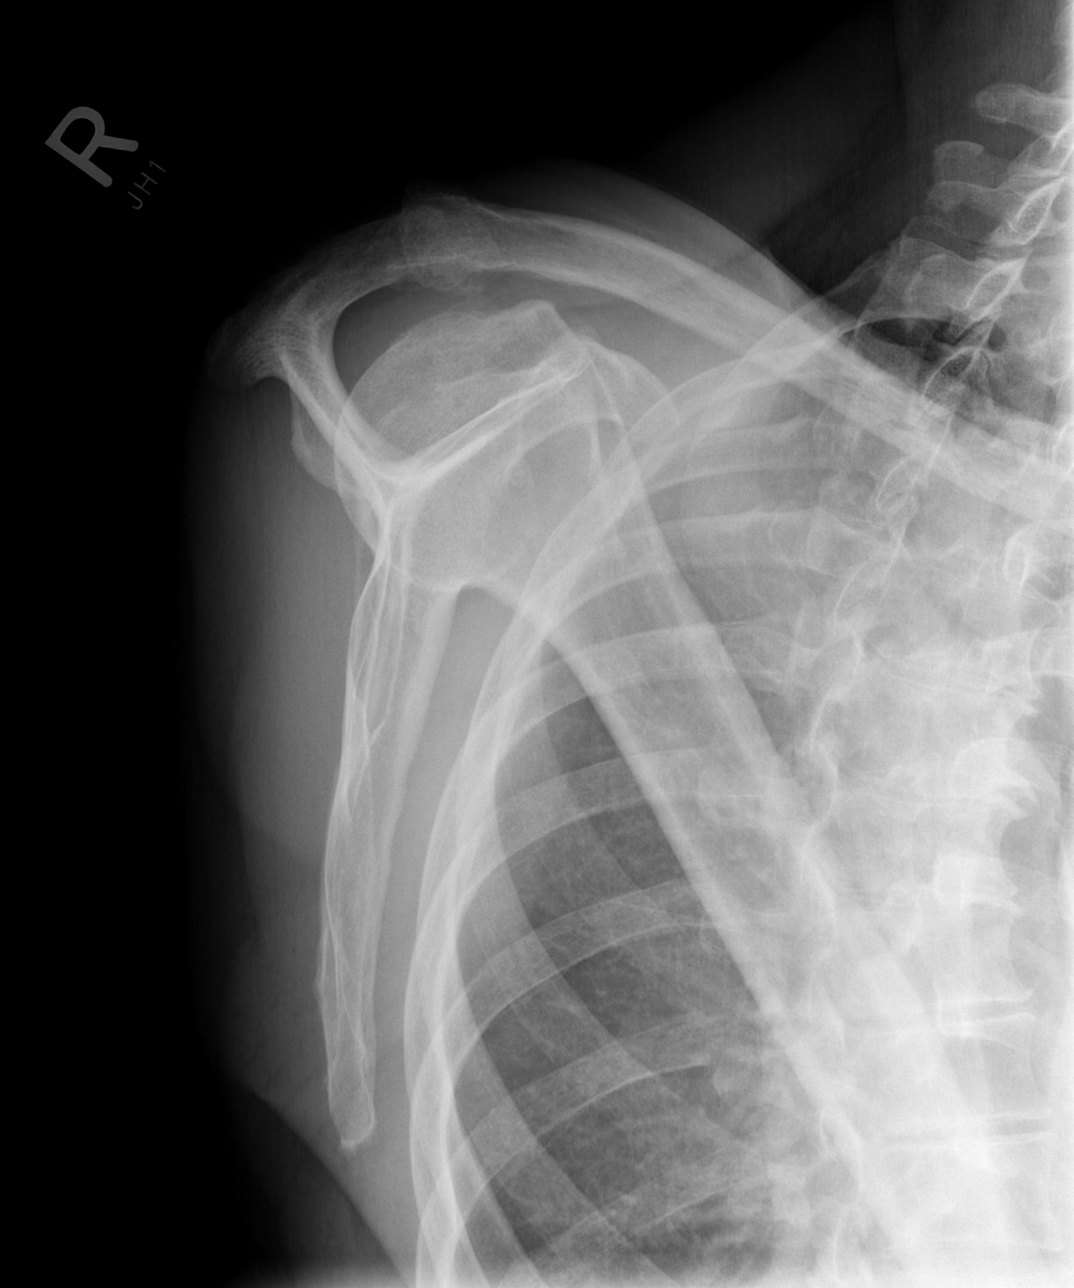

[x shoulder axillary right]
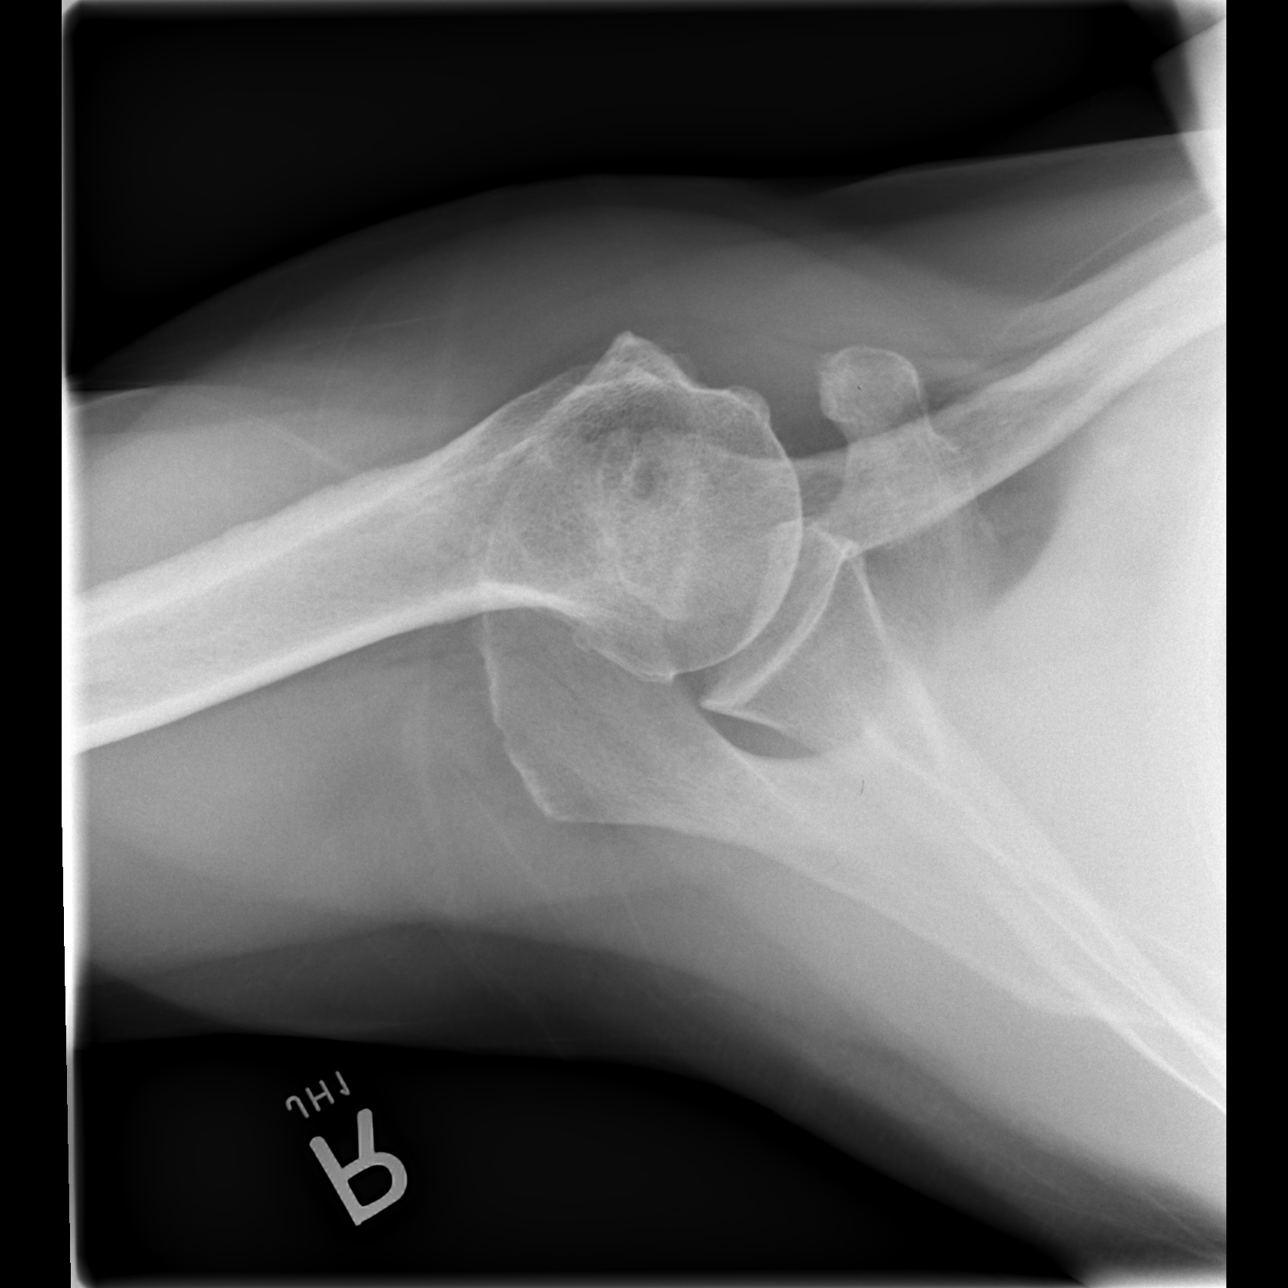

[3 of 3 positions shown; findings below may reference images not displayed]

FINDINGS: There is no evidence of fracture or dislocation. Moderate
degenerative changes seen involving the right acromioclavicular
joint. Soft tissues are unremarkable.
IMPRESSION: Moderate degenerative joint disease of the right acromioclavicular
joint. No acute abnormality seen.

## 2020-02-25 DIAGNOSIS — H5213 Myopia, bilateral: Secondary | ICD-10-CM | POA: Diagnosis not present

## 2020-02-25 DIAGNOSIS — H52203 Unspecified astigmatism, bilateral: Secondary | ICD-10-CM | POA: Diagnosis not present

## 2020-02-25 DIAGNOSIS — H2513 Age-related nuclear cataract, bilateral: Secondary | ICD-10-CM | POA: Diagnosis not present

## 2020-02-25 DIAGNOSIS — H524 Presbyopia: Secondary | ICD-10-CM | POA: Diagnosis not present

## 2020-03-11 DIAGNOSIS — I10 Essential (primary) hypertension: Secondary | ICD-10-CM | POA: Diagnosis not present

## 2020-03-25 DIAGNOSIS — G459 Transient cerebral ischemic attack, unspecified: Secondary | ICD-10-CM | POA: Diagnosis not present

## 2020-03-25 DIAGNOSIS — I4430 Unspecified atrioventricular block: Secondary | ICD-10-CM | POA: Diagnosis not present

## 2020-03-25 DIAGNOSIS — R55 Syncope and collapse: Secondary | ICD-10-CM | POA: Diagnosis not present

## 2020-03-25 DIAGNOSIS — I440 Atrioventricular block, first degree: Secondary | ICD-10-CM | POA: Diagnosis not present

## 2020-03-25 DIAGNOSIS — R410 Disorientation, unspecified: Secondary | ICD-10-CM | POA: Diagnosis not present

## 2020-03-26 DIAGNOSIS — I10 Essential (primary) hypertension: Secondary | ICD-10-CM | POA: Diagnosis not present

## 2020-03-26 DIAGNOSIS — R55 Syncope and collapse: Secondary | ICD-10-CM | POA: Diagnosis not present

## 2020-04-08 DIAGNOSIS — I10 Essential (primary) hypertension: Secondary | ICD-10-CM | POA: Diagnosis not present

## 2020-06-04 ENCOUNTER — Other Ambulatory Visit: Payer: Self-pay | Admitting: Physician Assistant

## 2020-06-04 ENCOUNTER — Ambulatory Visit
Admission: RE | Admit: 2020-06-04 | Discharge: 2020-06-04 | Disposition: A | Discharge status: Home / Self Care | Payer: Medicare Other | Source: Ambulatory Visit | Attending: Physician Assistant | Admitting: Physician Assistant

## 2020-06-04 DIAGNOSIS — M25511 Pain in right shoulder: Secondary | ICD-10-CM | POA: Diagnosis not present

## 2020-06-04 DIAGNOSIS — M542 Cervicalgia: Secondary | ICD-10-CM | POA: Diagnosis not present

## 2020-06-04 IMAGING — CR DG CERVICAL SPINE 2 OR 3 VIEWS
4 series · 4 of 4 positions shown · non-contrast
Comparison: [DATE].

CLINICAL DATA: Chronic neck pain without known injury.

EXAM:
CERVICAL SPINE - 2-3 VIEW

[w c-spine lat]
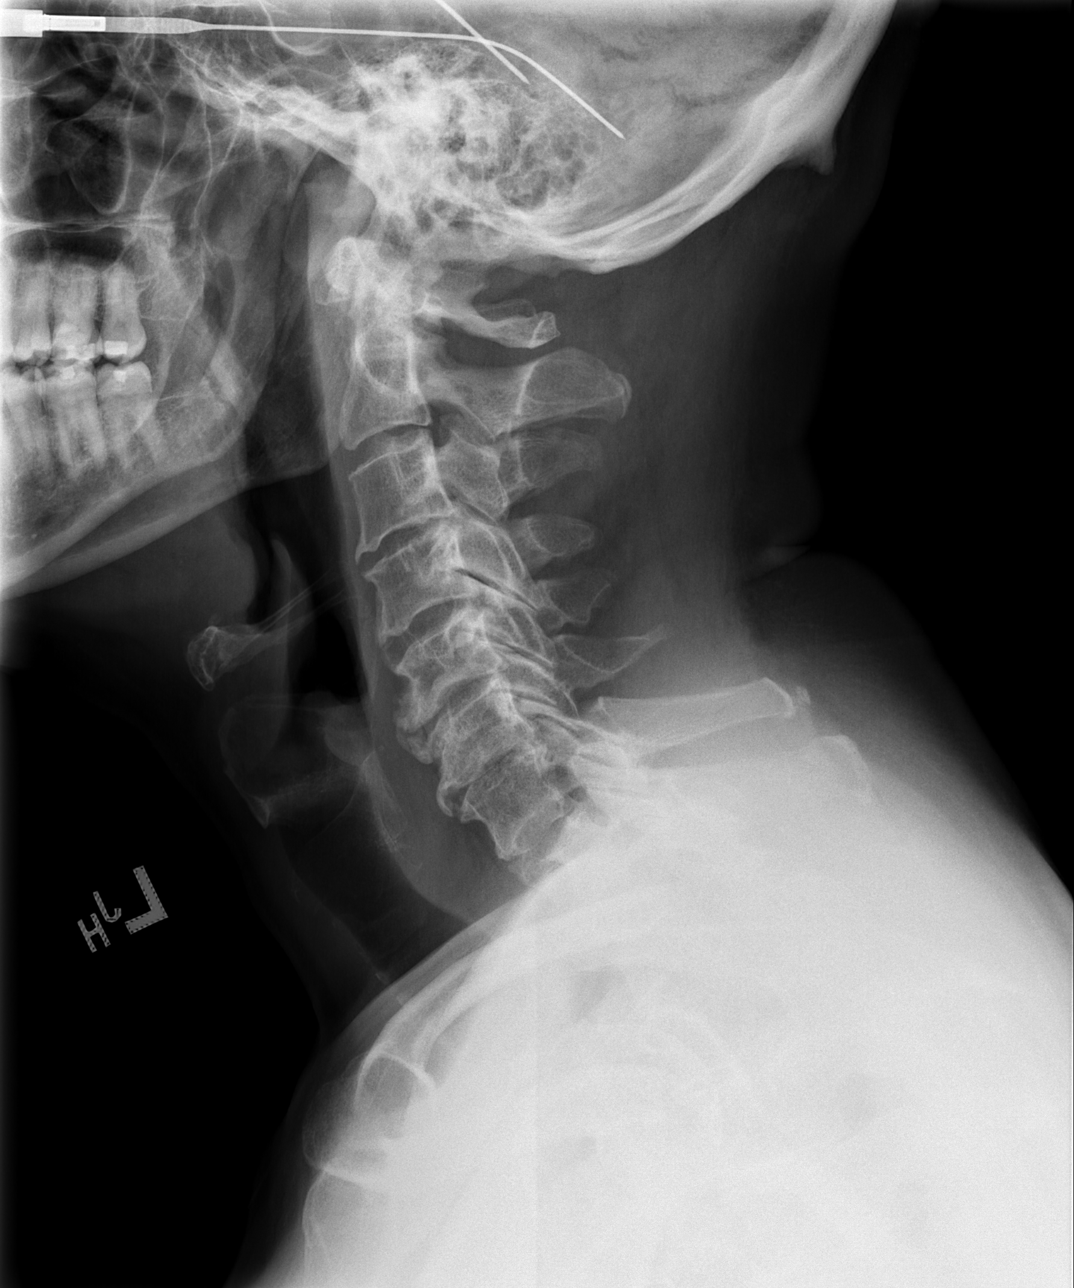

[w c-spine a.p. *]
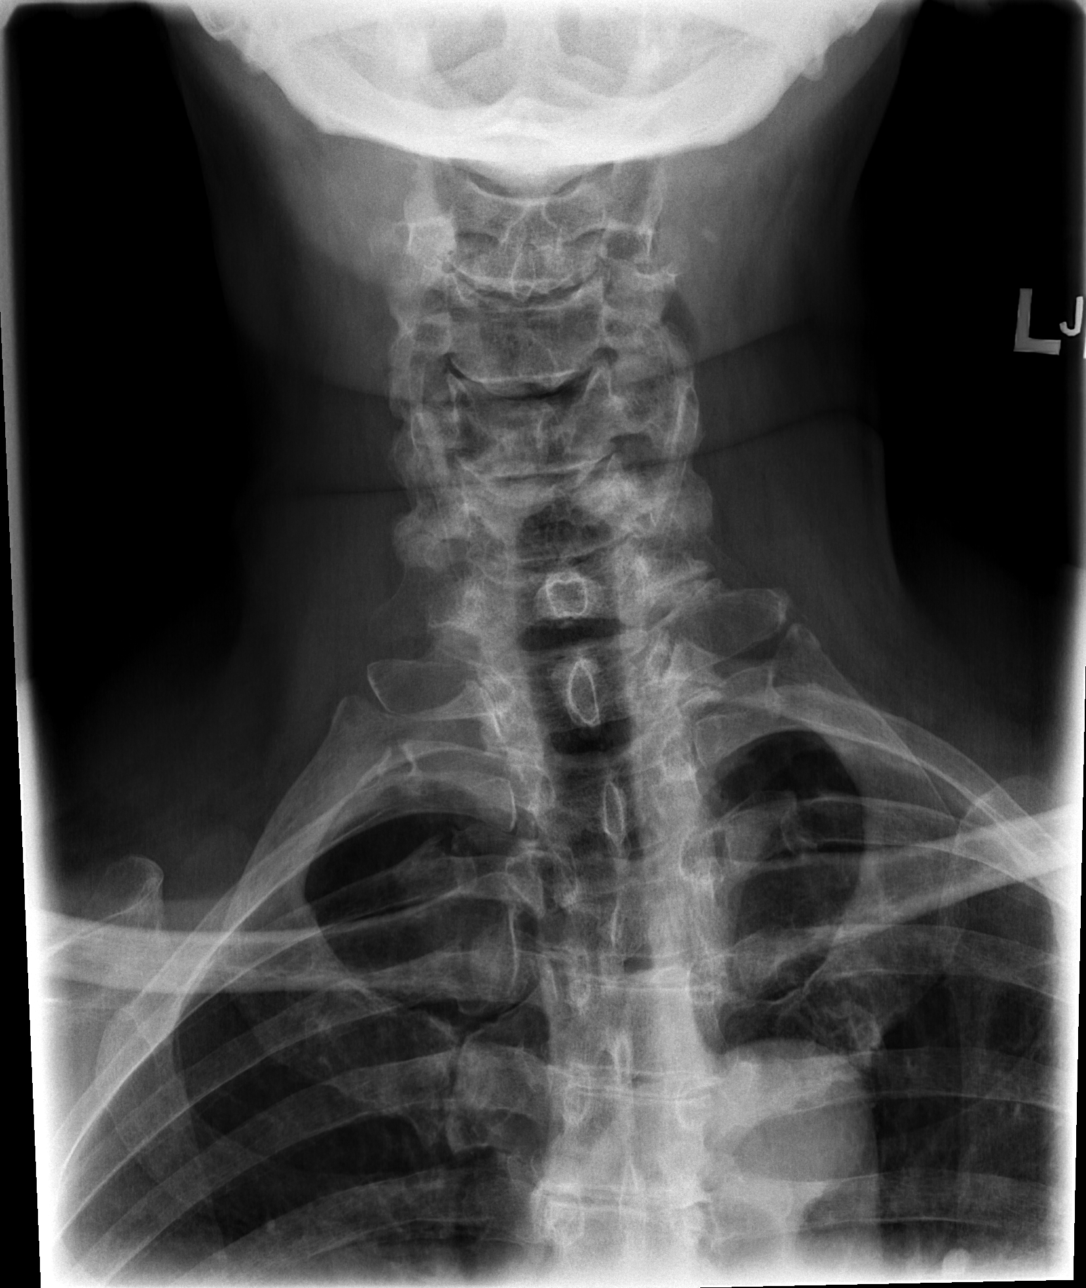

[w c-spine odontoid * (1 of 2)]
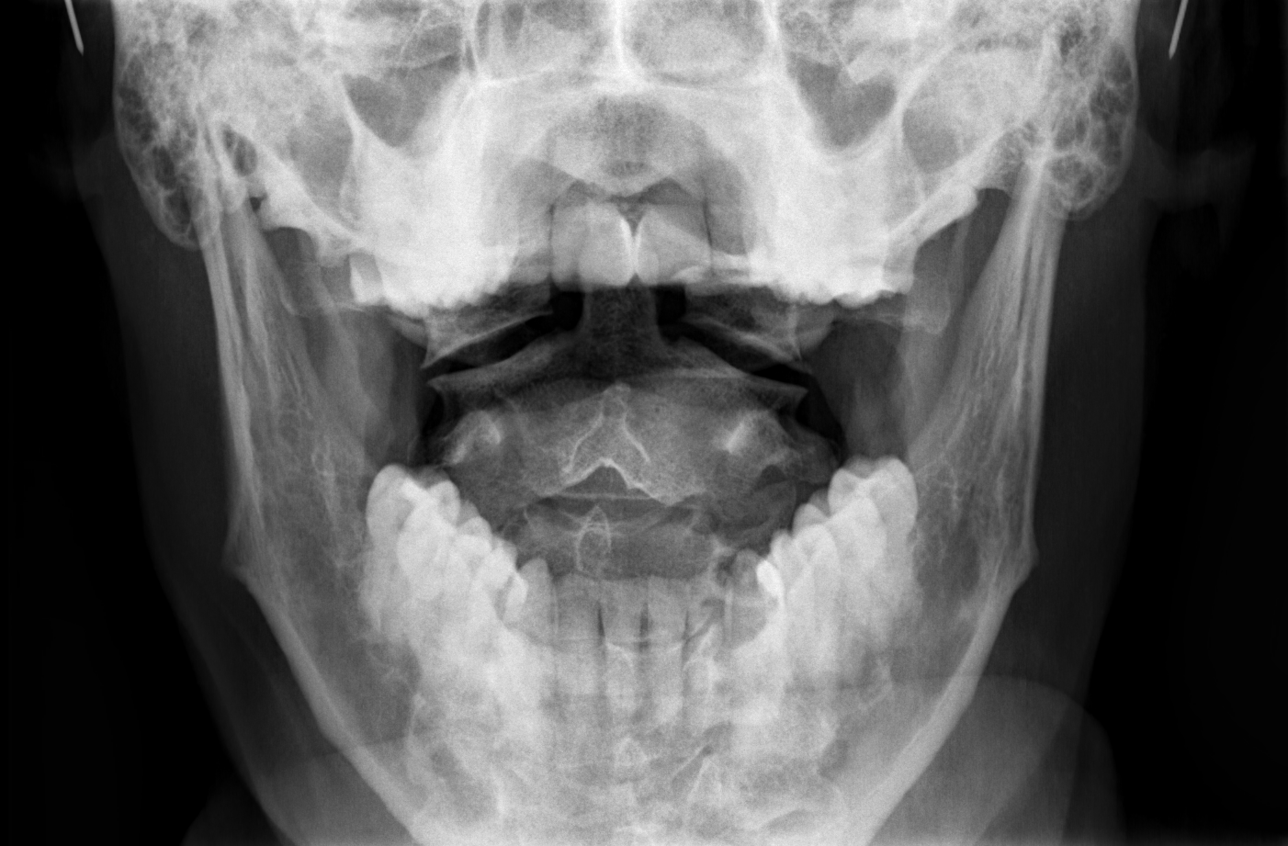

[w c-spine odontoid * (2 of 2)]
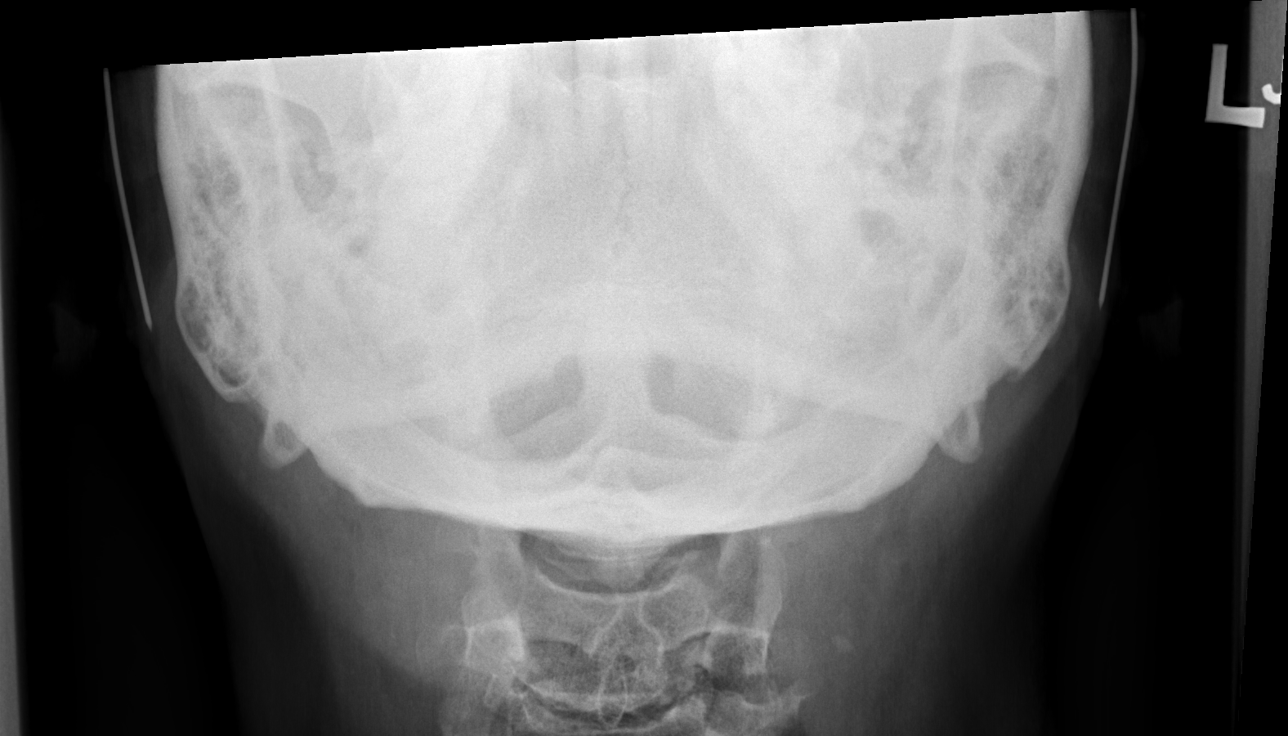

[4 of 4 positions shown; findings below may reference images not displayed]

FINDINGS: No fracture or spondylolisthesis is noted. Mild degenerative disc
disease is noted at C3-4. Moderate degenerative disc disease is
noted at C5-6 and C6-7. No prevertebral soft tissue swelling is
noted.
IMPRESSION: Multilevel degenerative disc disease. No acute abnormality seen in
the cervical spine.

## 2020-06-11 ENCOUNTER — Telehealth: Payer: Self-pay | Admitting: Genetic Counselor

## 2020-06-11 NOTE — Telephone Encounter (Signed)
Received a genetic counseling referral from Dr. Laurann Montana for fhx of pancreatic cancer. Gerald Lynch returned my call and has been scheduled to see Gerald Lynch on 6/28 at 1pm for an in person visit. Pt aware to arrive 15 minutes early.

## 2020-06-13 ENCOUNTER — Encounter: Payer: Self-pay | Admitting: Genetic Counselor

## 2020-06-16 ENCOUNTER — Inpatient Hospital Stay: Payer: Medicare Other | Attending: Genetic Counselor

## 2020-06-16 ENCOUNTER — Inpatient Hospital Stay (HOSPITAL_BASED_OUTPATIENT_CLINIC_OR_DEPARTMENT_OTHER): Payer: Medicare Other | Admitting: Genetic Counselor

## 2020-06-16 ENCOUNTER — Encounter: Payer: Self-pay | Admitting: Genetic Counselor

## 2020-06-16 ENCOUNTER — Other Ambulatory Visit: Payer: Self-pay

## 2020-06-16 DIAGNOSIS — Z800 Family history of malignant neoplasm of digestive organs: Secondary | ICD-10-CM

## 2020-06-16 DIAGNOSIS — Z8042 Family history of malignant neoplasm of prostate: Secondary | ICD-10-CM | POA: Insufficient documentation

## 2020-06-16 NOTE — Progress Notes (Signed)
REFERRING PROVIDER: Lavone Orn, MD [address] [place],  [place] [address]  PRIMARY PROVIDER:  Lavone Orn, MD  PRIMARY REASON FOR VISIT:  1. Family history of pancreatic cancer   2. Family history of prostate cancer      HISTORY OF PRESENT ILLNESS:   [name], a 69 y.o. male, was seen for a [facility] cancer genetics consultation at the request of Dr. Laurann Montana due to a family history of pancreatic cancer.  Gerald Lynch presents to clinic today to discuss the possibility of a hereditary predisposition to cancer, genetic testing, and to further clarify his future cancer risks, as well as potential cancer risks for family members.    Gerald Lynch is a 69 y.o. male with no personal history of cancer.     RISK FACTORS:  Prostate cancer screening: PSA annually; all normal  Colonoscopy: yes; most recent 2018; due in 2021; 5-6 lifetime polyps reported. Any excessive radiation exposure in the past: No  Past Medical History:  Diagnosis Date  . Family history of pancreatic cancer   . Family history of prostate cancer   . Hyperlipidemia   . Hypertension     Past Surgical History:  Procedure Laterality Date  . KNEE ARTHROSCOPY      Social History   Socioeconomic History  . Marital status: Married    Spouse name: Not on file  . Number of children: Not on file  . Years of education: Not on file  . Highest education level: Not on file  Occupational History  . Not on file  Tobacco Use  . Smoking status: Never Smoker  Substance and Sexual Activity  . Alcohol use: Yes    Comment: 1 drink daily  . Drug use: No  . Sexual activity: Not on file  Other Topics Concern  . Not on file  Social History Narrative  . Not on file   Social Determinants of Health   Financial Resource Strain:   . Difficulty of Paying Living Expenses:   Food Insecurity:   . Worried About Charity fundraiser in the Last Year:   . Arboriculturist in the Last Year:   Transportation  Needs:   . Film/video editor (Medical):   Marland Kitchen Lack of Transportation (Non-Medical):   Physical Activity:   . Days of Exercise per Week:   . Minutes of Exercise per Session:   Stress:   . Feeling of Stress :   Social Connections:   . Frequency of Communication with Friends and Family:   . Frequency of Social Gatherings with Friends and Family:   . Attends Religious Services:   . Active Member of Clubs or Organizations:   . Attends Archivist Meetings:   Marland Kitchen Marital Status:      FAMILY HISTORY:  We obtained a detailed, 4-generation family history.  Significant diagnoses are listed below: Family History  Problem Relation Age of Onset  . Hypertension Other   . Pancreatic cancer Sister 34  . Lung cancer Brother 49  . Prostate cancer Brother 40  . Pancreatic cancer Brother 68    Gerald Lynch has four brothers and two sisters.  His brother was diagnosed with pancreatic cancer recently at age 43 and passed away at age 14.  His sister was diagnosed with pancreatic cancer at age 47 and passed away at age 29.  He has a brother that passed away from lung cancer, diagnosed in his early 25s, who also had a history  of prostate cancer in his late 101s.  Gerald Lynch did not report a cancer history in his nieces or nephews.    Gerald Lynch's mother passed away at 71 years old, and his father passed away in his late 34s.  He did not report that either had a history of cancer.  He had limited information about his maternal and paternal aunts, uncles, cousins, and grandparents but reported they had no known history of cancer.  Gerald Lynch has two daughter with no history of cancer.  He has two grandsons and one granddaughter.   Gerald Lynch is unaware of previous family history of genetic testing for hereditary cancer risks. Patient's maternal ancestors are of African American descent, and paternal ancestors are of African American descent. There is no reported Ashkenazi Jewish ancestry. There is no  known consanguinity.         GENETIC COUNSELING ASSESSMENT: [name] is a 69 y.o. male with a family history of pancreatic cancer which is somewhat suggestive of a hereditary cancer syndrome and predisposition to cancer. We, therefore, discussed and recommended the following at today's visit.   DISCUSSION: We discussed that approximately 5-10% of pancreatic cancer is hereditary, with a significant portion of cases associated with hereditary breast and ovarian cancer (HBOC; BRCA1/2 genes).  There are other genes that can be associated with hereditary pancreatic cancer syndromes.  These include but are not limited to CDKN2A, PALB2, STK11, and ATM .  We discussed that genetic testing is beneficial for several reasons, including knowing about cancer risks, identifying potential screening and risk-reducing options that may be appropriate, and to understand if other family members could be at risk for cancer and allow them to undergo genetic testing.   We reviewed the characteristics, features and inheritance patterns of hereditary cancer syndromes. We also discussed genetic testing, including the appropriate family members to test, the process of testing, insurance coverage and turn-around-time for results. We discussed the implications of a negative, positive, carrier and/or variant of uncertain significant result. We recommended Gerald Lynch pursue genetic testing by a hereditary cancer gene panel that includes genes associated with pancreatic and prostate cancer.  Gerald Lynch was offered a common hereditary cancer panel (47 genes) and an expanded pan-cancer panel (85 genes). Gerald Lynch was informed of the benefits and limitations of each panel, including that expanded pan-cancer panels contain several preliminary evidence genes that do not have clear management guidelines at this point in time.  We also discussed that as the number of genes included on a panel increases, the chances of variants of  uncertain significance increases.  After considering the benefits and limitations of each gene panel, Gerald Lynch elected to have an expanded pan-cancer panel through [facility].  We discussed the benefits, limitations, and methodology of the Multi-Cancer Panel. The Multi-Cancer Panel offered by [facility] includes sequencing and/or deletion duplication testing of the following 85 genes: AIP, ALK, APC, ATM, AXIN2,BAP1,  BARD1, BLM, BMPR1A, BRCA1, BRCA2, BRIP1, CASR, CDC73, CDH1, CDK4, CDKN1B, CDKN1C, CDKN2A (p14ARF), CDKN2A (p16INK4a), CEBPA, CHEK2, CTNNA1, DICER1, DIS3L2, EGFR (c.2369C>T, p.Thr790Met variant only), EPCAM (Deletion/duplication testing only), FH, FLCN, GATA2, GPC3, GREM1 (Promoter region deletion/duplication testing only), HOXB13 (c.251G>A, p.Gly84Glu), HRAS, KIT, MAX, MEN1, MET, MITF (c.952G>A, p.Glu318Lys variant only), MLH1, MSH2, MSH3, MSH6, MUTYH, NBN, NF1, NF2, NTHL1, PALB2, PDGFRA, PHOX2B, PMS2, POLD1, POLE, POT1, PRKAR1A, PTCH1, PTEN, RAD50, RAD51C, RAD51D, RB1, RECQL4, RET, RNF43, RUNX1, SDHAF2, SDHA (sequence changes only), SDHB, SDHC, SDHD, SMAD4, SMARCA4, SMARCB1, SMARCE1, STK11, SUFU, TERC, TERT, TMEM127, TP53, TSC1,  TSC2, VHL, WRN and WT1.   Based on Mr. Parzych's family history of cancer, he meets medical criteria for genetic testing. Despite that he meets criteria, he may still have an out of pocket cost. We discussed that if his out of pocket cost for testing is over $100, the laboratory will call and confirm whether he wants to proceed with testing.  If the out of pocket cost of testing is less than $100 he will be billed by the genetic testing laboratory.   We discussed that some people do not want to undergo genetic testing due to fear of genetic discrimination.  A federal law called the Genetic Information Non-Discrimination Act (GINA) of 2008 helps protect individuals against genetic discrimination based on their genetic test results.  It impacts both health insurance and  employment.  With health insurance, it protects against increased premiums, being kicked off insurance or being forced to take a test in order to be insured.  For employment it protects against hiring, firing and promoting decisions based on genetic test results.  Health status due to a cancer diagnosis is not protected under GINA.  Mr. Bachmeier has been determined to be at high risk for pancreatic cancer.  Given that Mr. Woolverton has two first degree relatives with pancreatic cancer, he meets Artist (NCCN) criteria and International Cancer of the Pancreas Screening (CAPS) Consortium criteria for consideration of pancreatic cancer screening, such as by contrast-enhanced MRI/MRCP and/or endoscopic ultrasound (EUS). Therefore, we recommend that pancreatic cancer screening should be considered for Mr. Gladhill. Mr. Greenfeld plans to discuss a pancreatic cancer screening plan further with his GI physician through [facility].    PLAN: After considering the risks, benefits, and limitations, Mr. Pietrzak provided informed consent to pursue genetic testing and the blood sample was sent to [facility] for analysis of the Multi-Cancer Panel. Results should be available within approximately 2-3 weeks, at which point they will be disclosed by telephone to Mr. Besancon, as will any additional recommendations warranted by these results. Mr. Urizar will receive a summary of his genetic counseling visit and a copy of his results once available. This information will also be available in Epic.    Based on Mr. Hackman's family history, we recommend that other first degree relatives of those who passed away from pancreatic cancer have genetic counseling and testing. Mr. Reggio will let us know if we can be of any assistance in coordinating genetic counseling and/or testing for these family members.   Lastly, we encouraged Mr. Chew to remain in contact with cancer genetics annually so that we  can continuously update the family history and inform him of any changes in cancer genetics and testing that may be of benefit for this family.   Mr. Zaring questions were answered to his satisfaction today. Our contact information was provided should additional questions or concerns arise. Thank you for the referral and allowing [place] to share in the care of your patient.    Lion Fernandez M. Joette Catching, [email or web address] (P) [phone]   The patient was seen for a total of 35  minutes in face-to-face genetic counseling.  This patient was discussed with Drs. Magrinat, Lindi Adie and/or Burr Medico who agrees with the above.    _______________________________________________________________________ For Office Staff:  Number of people involved in session: 1 Was an Intern/ student involved with case: no

## 2020-07-07 ENCOUNTER — Telehealth: Payer: Self-pay | Admitting: Genetic Counselor

## 2020-07-07 NOTE — Telephone Encounter (Signed)
Revealed negative genetic testing.  Discussed that we do not know why why there is pancreatic cancer in the family. His negative results may be indicative of an unknown gene that was not tested, that he did not inherit a familial mutation, or that he is truly negative. It will be important for him to keep in contact with genetics to keep up with whether additional testing may be needed.  We also discussed that he meets criteria for consideration of pancreatic cancer screening based on his family history.

## 2020-07-07 NOTE — Telephone Encounter (Signed)
Called patient to attempt to disclose genetic testing results.  Left message on voicemail requesting a call back.

## 2020-07-08 ENCOUNTER — Ambulatory Visit: Payer: Self-pay | Admitting: Genetic Counselor

## 2020-07-08 ENCOUNTER — Encounter: Payer: Self-pay | Admitting: Genetic Counselor

## 2020-07-08 DIAGNOSIS — Z1379 Encounter for other screening for genetic and chromosomal anomalies: Secondary | ICD-10-CM

## 2020-07-08 DIAGNOSIS — Z800 Family history of malignant neoplasm of digestive organs: Secondary | ICD-10-CM

## 2020-07-08 DIAGNOSIS — Z8042 Family history of malignant neoplasm of prostate: Secondary | ICD-10-CM

## 2020-07-08 NOTE — Progress Notes (Signed)
HPI:  [name] was previously seen in the [facility] clinic due to a family history of pancreatic cancer and concerns regarding a hereditary predisposition to cancer. Please refer to our prior cancer genetics clinic note for more information regarding our discussion, assessment and recommendations, at the time. [name] recent genetic test results were disclosed to him, as were recommendations warranted by these results. These results and recommendations are discussed in more detail below.  CANCER HISTORY:  [name] does not have a personal history of cancer.   FAMILY HISTORY:  We obtained a detailed, 4-generation family history.  Significant diagnoses are listed below: Family History  Problem Relation Age of Onset  . Hypertension Other   . Pancreatic cancer Sister 30  . Lung cancer Brother 30  . Prostate cancer Brother 87  . Pancreatic cancer Brother 29   [name] has four brothers and two sisters. His brother was diagnosed with pancreatic cancer recently at age 14 and passed away at age 38. His sister was diagnosed with pancreatic cancer at age 28 and passed away at age 75. He has a brother that passed away from lung cancer,  diagnosed in his early 7s, who also had a history of prostate cancer in his late 25s. [name] did not report a cancer history in his nieces or nephews.  [name]'s mother passed away at 75 years old, and his father passed away in his late 65s. He did not report that either had a history of cancer. He had limited information about his maternal and paternal aunts, uncles, cousins, and grandparents but reported they had no known history of cancer.   [name] has two daughters with no history of cancer. He has two grandsons and one granddaughter.   [name] is unaware of previous family history of genetic testing for hereditary cancer risks. Patient's maternal ancestors are of African American descent, and paternal ancestors are of African  American descent. There is no reported Ashkenazi Jewish ancestry. There is no known consanguinity.     GENETIC TEST RESULTS: Genetic testing reported out on July 04, 2020 through [facility] and found no pathogenic mutations. The Multi-Gene Panel offered by [facility] includes sequencing and/or deletion duplication testing of the following 85 genes: AIP, ALK, APC, ATM, AXIN2,BAP1,  BARD1, BLM, BMPR1A, BRCA1, BRCA2, BRIP1, CASR, CDC73, CDH1, CDK4, CDKN1B, CDKN1C, CDKN2A (p14ARF), CDKN2A (p16INK4a), CEBPA, CHEK2, CTNNA1, DICER1, DIS3L2, EGFR (c.2369C>T, p.Thr790Met variant only), EPCAM (Deletion/duplication testing only), FH, FLCN, GATA2, GPC3, GREM1 (Promoter region deletion/duplication testing only), HOXB13 (c.251G>A, p.Gly84Glu), HRAS, KIT, MAX, MEN1, MET, MITF (c.952G>A, p.Glu318Lys variant only), MLH1, MSH2, MSH3, MSH6, MUTYH, NBN, NF1, NF2, NTHL1, PALB2, PDGFRA, PHOX2B, PMS2, POLD1, POLE, POT1, PRKAR1A, PTCH1, PTEN, RAD50, RAD51C, RAD51D, RB1, RECQL4, RET, RNF43, RUNX1, SDHAF2, SDHA (sequence changes only), SDHB, SDHC, SDHD, SMAD4, SMARCA4, SMARCB1, SMARCE1, STK11, SUFU, TERC, TERT, TMEM127, TP53, TSC1, TSC2, VHL, WRN and WT1. . The test report has been scanned into EPIC and is located under the Molecular Pathology section of the Results Review tab.  A portion of the result report is included below for reference.      We discussed with [name] that because current genetic testing is not perfect, it is possible there may be a gene mutation in one of these genes that current testing cannot detect, but that chance is small.  We also discussed, that there could be another gene that has not yet been discovered, or that we have not yet tested, that is responsible  for the cancer diagnoses in the family. It is also possible there is a hereditary cause for the cancer in the family that [name] did not inherit and therefore was not identified in his testing.  Therefore, it is important to remain in touch with  cancer genetics in the future so that we can continue to offer [name] the most up to date genetic testing.   ADDITIONAL GENETIC TESTING: We discussed with [name] that his genetic testing was fairly extensive.  If there are genes identified to increase cancer risk that can be analyzed in the future, we would be happy to discuss and coordinate this testing at that time.    CANCER SCREENING RECOMMENDATIONS: [name] test result is considered negative (normal).  This means that we have not identified a hereditary cause for his family history of pancreatic cancer at this time. Most cancers happen by chance and this negative test suggests that his cancer may fall into this category.    Given [name]'s family history, we must interpret these negative results with some caution.  Families with features suggestive of hereditary risk for cancer tend to have multiple family members with cancer, diagnoses in multiple generations and diagnoses before the age of 49. [name] family exhibits some of these features. Thus, this result may simply reflect our current inability to detect all mutations within these genes or there may be a different gene that has not yet been discovered or tested.  Alternatively, he could be truly negative.  An individual's cancer risk and medical management are not determined by genetic test results alone. Overall cancer risk assessment incorporates additional factors, including personal medical history, family history, and any available genetic information that may result in a personalized plan for cancer prevention and surveillance.  Given that [name] has two first degree relatives with pancreatic cancer, he meets Artist (NCCN) criteria and International Cancer of the Pancreas Screening (CAPS)  Consortium criteria for consideration of pancreatic cancer screening, such as by contrastenhanced MRI/MRCP and/or endoscopic ultrasound (EUS).  Therefore, we recommend that pancreatic cancer screening should be considered for [name]. [name] plans to discuss a pancreatic cancer screening plan further with his gastroenterologist, Dr. Otis Lynch.  He will inform the genetics team if he wishes to have a referral to a gastroenterologist to discuss pancreatic cancer screening.   RECOMMENDATIONS FOR FAMILY MEMBERS:  Individuals in this family might be at some increased risk of developing cancer, over the general population risk, simply due to the family history of cancer.  We recommended women in this family have a yearly mammogram beginning at age 71, or 22 years younger than the earliest onset of cancer, an annual clinical breast exam, and perform monthly breast self-exams. Women in this family should also have a gynecological exam as recommended by their primary provider. All family members should be referred for colonoscopy starting at age 81.  Others in the family, including [name]'s siblings and nieces/nephews, may also be considered for pancreatic cancer screening.   It is also possible there is a hereditary cause for the cancer in [name] family that he did not inherit and therefore was not identified in him.  Based on [name]'s family history, we recommended that first degree relatives of those with a history of pancreatic cancer have genetic counseling and testing.  Therefore, [name]'s siblings and nieces/ nephews should have genetic counseling and consider genetic testing. [name] will let us know if  we can be of any assistance in coordinating genetic counseling and/or testing for these family members.   FOLLOW-UP: Lastly, we discussed with [name] that cancer genetics is a rapidly advancing field and it is possible that new genetic tests will be appropriate for him and/or his family members in the future. We encouraged him to remain in contact with cancer genetics on an annual basis so we can update his  personal and family histories and let him know of advances in cancer genetics that may benefit this family.   Our contact number was provided. Mr. Kazmierczak questions were answered to his satisfaction, and he knows he is welcome to call us at anytime with additional questions or concerns.    Jasiah Buntin M. Joette Catching, [email or web address] (P) [phone]

## 2020-07-17 DIAGNOSIS — M4722 Other spondylosis with radiculopathy, cervical region: Secondary | ICD-10-CM | POA: Diagnosis not present

## 2020-07-17 DIAGNOSIS — M25511 Pain in right shoulder: Secondary | ICD-10-CM | POA: Diagnosis not present

## 2020-07-17 DIAGNOSIS — M542 Cervicalgia: Secondary | ICD-10-CM | POA: Diagnosis not present

## 2020-08-11 ENCOUNTER — Other Ambulatory Visit: Payer: Self-pay | Admitting: Gastroenterology

## 2020-08-11 DIAGNOSIS — Z800 Family history of malignant neoplasm of digestive organs: Secondary | ICD-10-CM

## 2020-08-11 DIAGNOSIS — Z86010 Personal history of colonic polyps: Secondary | ICD-10-CM | POA: Diagnosis not present

## 2020-08-14 DIAGNOSIS — M25511 Pain in right shoulder: Secondary | ICD-10-CM | POA: Diagnosis not present

## 2020-08-14 DIAGNOSIS — Z1159 Encounter for screening for other viral diseases: Secondary | ICD-10-CM | POA: Diagnosis not present

## 2020-08-14 DIAGNOSIS — M4722 Other spondylosis with radiculopathy, cervical region: Secondary | ICD-10-CM | POA: Diagnosis not present

## 2020-08-14 DIAGNOSIS — M542 Cervicalgia: Secondary | ICD-10-CM | POA: Diagnosis not present

## 2020-08-19 DIAGNOSIS — D122 Benign neoplasm of ascending colon: Secondary | ICD-10-CM | POA: Diagnosis not present

## 2020-08-19 DIAGNOSIS — Z86010 Personal history of colonic polyps: Secondary | ICD-10-CM | POA: Diagnosis not present

## 2020-08-19 DIAGNOSIS — K635 Polyp of colon: Secondary | ICD-10-CM | POA: Diagnosis not present

## 2020-08-22 DIAGNOSIS — K635 Polyp of colon: Secondary | ICD-10-CM | POA: Diagnosis not present

## 2020-08-22 DIAGNOSIS — D122 Benign neoplasm of ascending colon: Secondary | ICD-10-CM | POA: Diagnosis not present

## 2020-08-26 DIAGNOSIS — M62830 Muscle spasm of back: Secondary | ICD-10-CM | POA: Diagnosis not present

## 2020-09-05 ENCOUNTER — Other Ambulatory Visit: Payer: BLUE CROSS/BLUE SHIELD

## 2020-09-05 DIAGNOSIS — Z23 Encounter for immunization: Secondary | ICD-10-CM | POA: Diagnosis not present

## 2020-09-13 ENCOUNTER — Ambulatory Visit
Admission: RE | Admit: 2020-09-13 | Discharge: 2020-09-13 | Disposition: A | Discharge status: Home / Self Care | Payer: Medicare Other | Source: Ambulatory Visit | Attending: Gastroenterology | Admitting: Gastroenterology

## 2020-09-13 DIAGNOSIS — N2889 Other specified disorders of kidney and ureter: Secondary | ICD-10-CM | POA: Diagnosis not present

## 2020-09-13 DIAGNOSIS — Z800 Family history of malignant neoplasm of digestive organs: Secondary | ICD-10-CM

## 2020-09-13 DIAGNOSIS — N281 Cyst of kidney, acquired: Secondary | ICD-10-CM | POA: Diagnosis not present

## 2020-09-13 DIAGNOSIS — R935 Abnormal findings on diagnostic imaging of other abdominal regions, including retroperitoneum: Secondary | ICD-10-CM | POA: Diagnosis not present

## 2020-09-13 IMAGING — MR MR ABDOMEN WO/W CM MRCP
19 of 20 series · 47 of 48 positions shown · IV contrast (17ml Multihance)
Comparison: None.

CLINICAL DATA: Family history of pancreatic carcinoma.

EXAM:
MRI ABDOMEN WITHOUT AND WITH CONTRAST (INCLUDING MRCP)
TECHNIQUE: Multiplanar multisequence MR imaging of the abdomen was performed
both before and after the administration of intravenous contrast.
Heavily T2-weighted images of the biliary and pancreatic ducts were
obtained, and three-dimensional MRCP images were rendered by post
processing.
CONTRAST:  17mL MULTIHANCE GADOBENATE DIMEGLUMINE 529 MG/ML IV SOLN

[Series 3: T2 · coronal · 5.0mm · 1.56mm/px · 1 of 36 slices shown (1 of 4)]
[im 1/36]
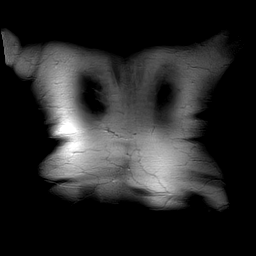

[Series 4: T1 · axial · 3.0mm · 1.19mm/px · z∈[-142,+95]mm · 5 of 160 slices shown]
[im 1/160]
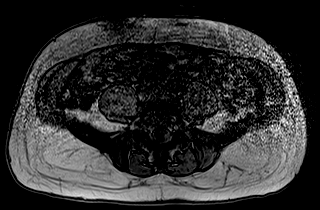
[im 40/160]
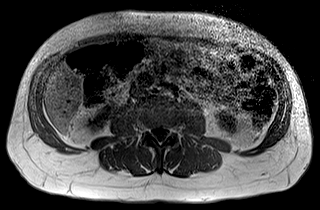
[im 80/160]
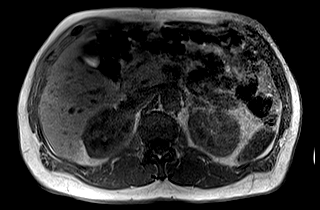
[im 120/160]
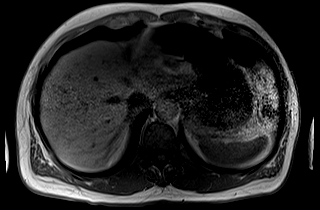
[im 160/160]
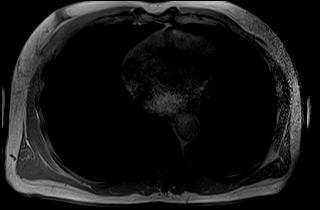

[Series 5: T2 · axial · 5.0mm · 1.48mm/px · z∈[-126,+138]mm · 2 of 45 slices shown (2 of 4)]
[im 1/45]
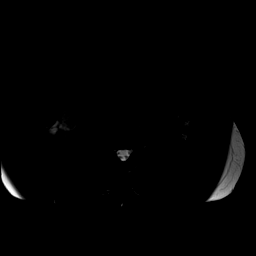
[im 45/45]
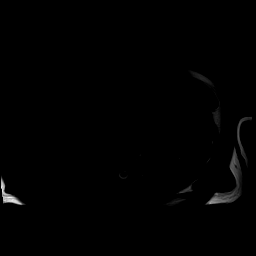

[Series 6: DWI · axial · 5.0mm · 1.42mm/px · z∈[-120,+132]mm · 4 of 129 slices shown (1 of 2)]
[im 1/129]
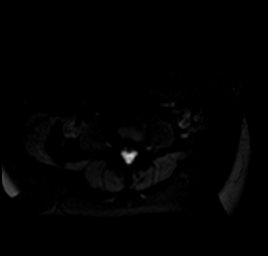
[im 43/129]
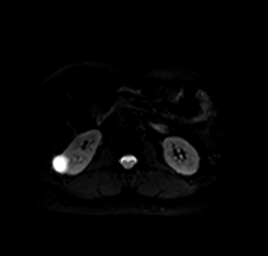
[im 86/129]
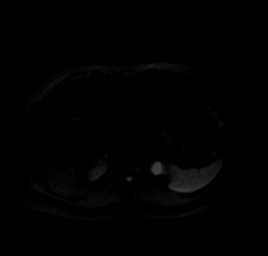
[im 129/129]
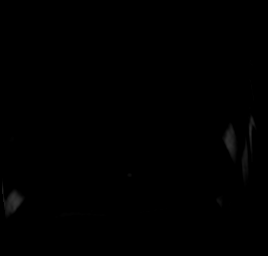

[Series 7: DWI · axial · 5.0mm · 1.42mm/px · 1 of 43 slices shown (2 of 2)]
[im 1/43]
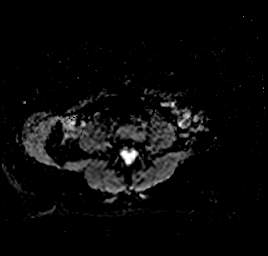

[Series 8: T2 · axial · 6.0mm · 1.22mm/px · 1 of 35 slices shown (3 of 4)]
[im 1/35]
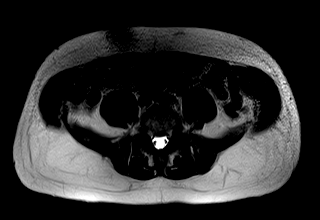

[Series 9: bSSFP · axial · 5.0mm · 1.25mm/px · 1 of 41 slices shown]
[im 1/41]
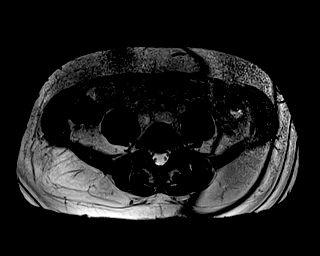

[Series 12: T2 · coronal · 3.0mm · 1.19mm/px · 1 of 19 slices shown (4 of 4)]
[im 1/19]
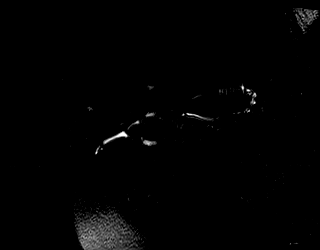

[Series 13: MRCP · coronal · 1.0mm · 0.49mm/px · 2 of 72 slices shown]
[im 1/72]
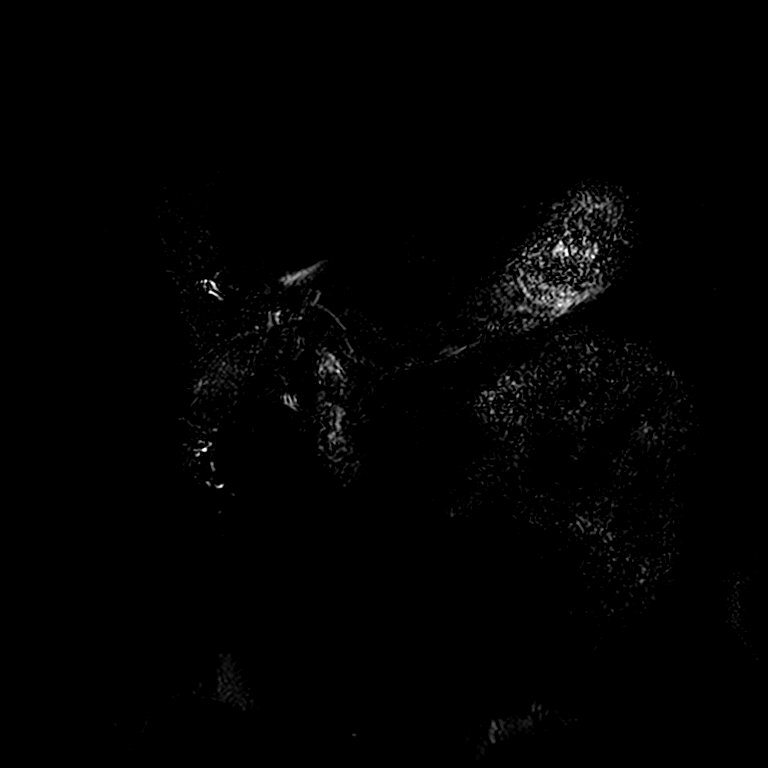
[im 72/72]
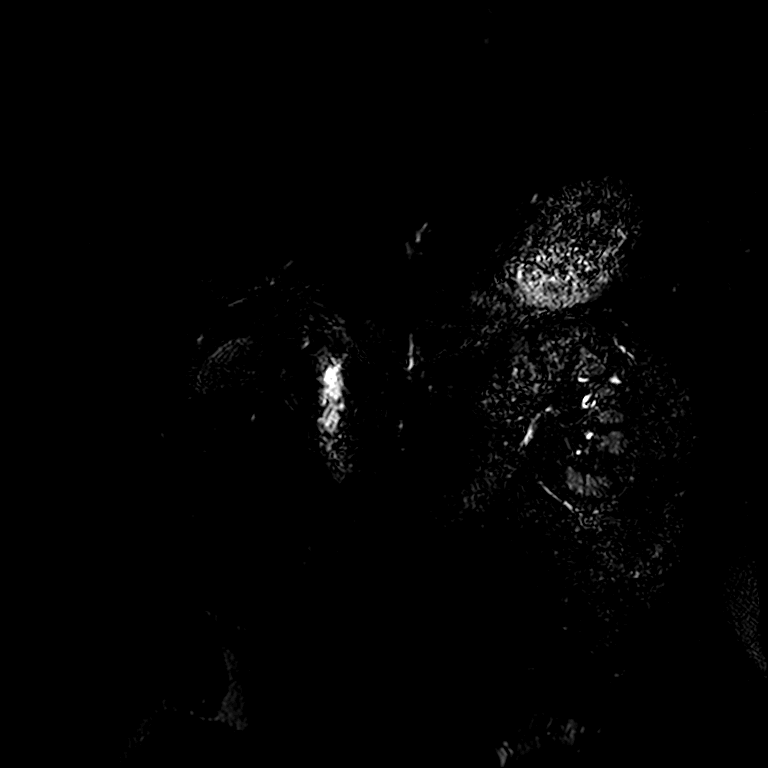

[Series 15: T1 dynamic · axial · non-contrast · 3.0mm · 1.25mm/px · z∈[-142,+95]mm · 3 of 80 slices shown]
[im 1/80]
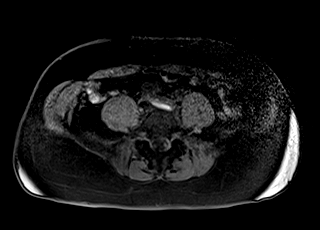
[im 40/80]
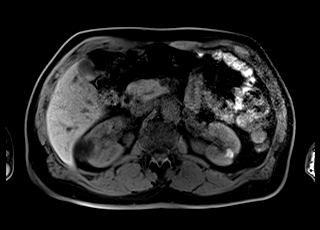
[im 80/80]
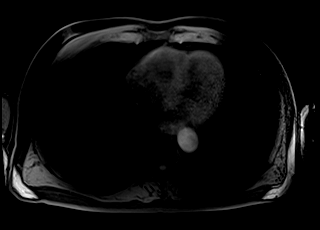

[Series 16: T1 dynamic post-contrast · axial · 3.0mm · 1.25mm/px · z∈[-142,+95]mm · 3 of 80 slices shown (1 of 9)]
[im 1/80]
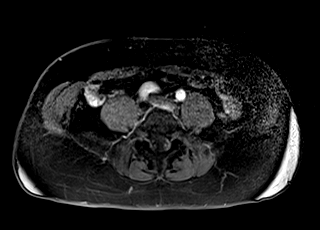
[im 40/80]
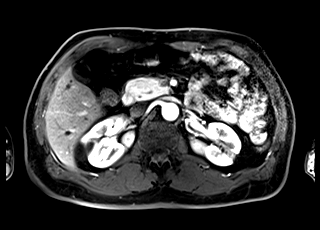
[im 80/80]
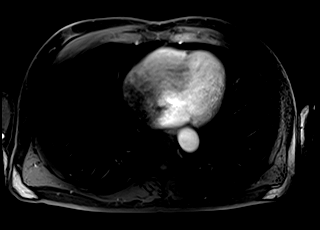

[Series 17: T1 dynamic post-contrast · axial · 3.0mm · 1.25mm/px · z∈[-142,+95]mm · 3 of 80 slices shown (2 of 9)]
[im 1/80]
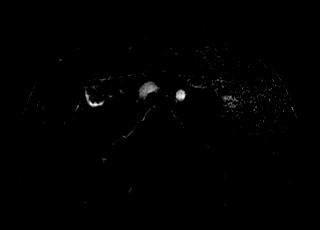
[im 40/80]
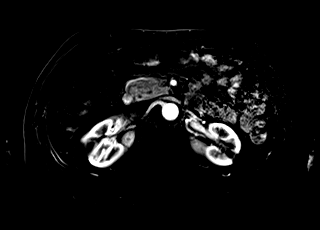
[im 80/80]
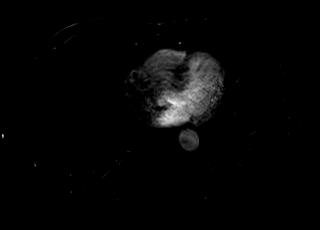

[Series 18: T1 dynamic post-contrast · axial · 3.0mm · 1.25mm/px · z∈[-142,+95]mm · 3 of 80 slices shown (3 of 9)]
[im 1/80]
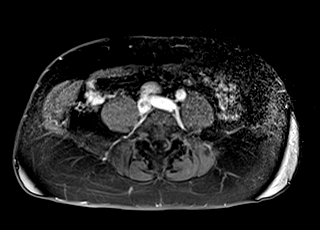
[im 40/80]
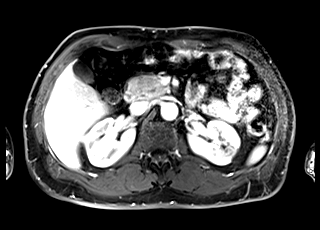
[im 80/80]
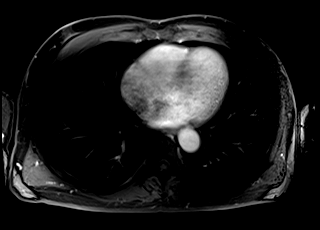

[Series 19: T1 dynamic post-contrast · axial · 3.0mm · 1.25mm/px · z∈[-142,+95]mm · 3 of 80 slices shown (4 of 9)]
[im 1/80]
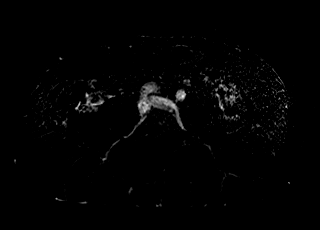
[im 40/80]
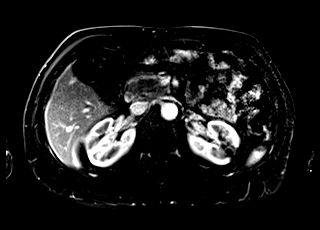
[im 80/80]
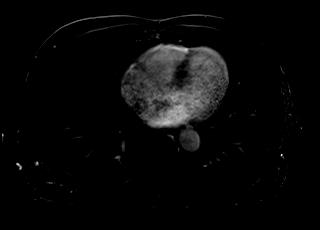

[Series 20: T1 dynamic post-contrast · axial · 3.0mm · 1.25mm/px · z∈[-142,+95]mm · 3 of 80 slices shown (5 of 9)]
[im 1/80]
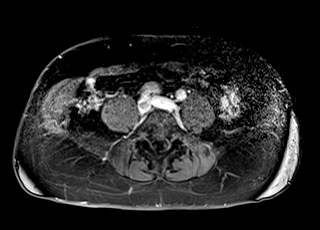
[im 40/80]
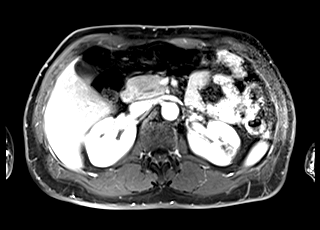
[im 80/80]
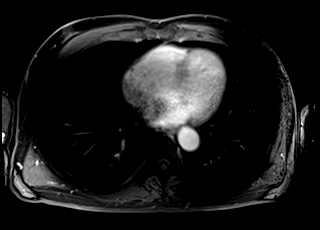

[Series 21: T1 dynamic post-contrast · axial · 3.0mm · 1.25mm/px · z∈[-142,+95]mm · 3 of 80 slices shown (6 of 9)]
[im 1/80]
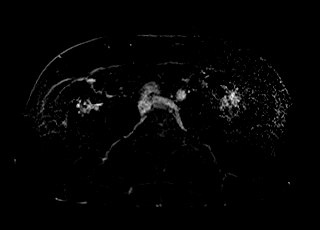
[im 40/80]
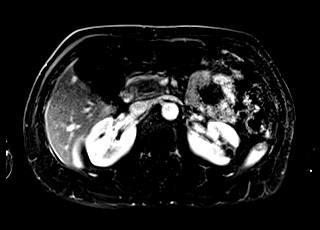
[im 80/80]
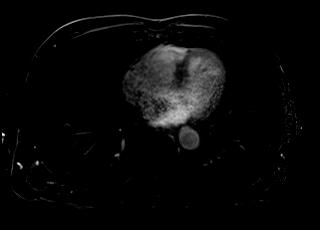

[Series 23: T1 dynamic post-contrast · coronal · 3.0mm · 1.25mm/px · 2 of 64 slices shown (7 of 9)]
[im 1/64]
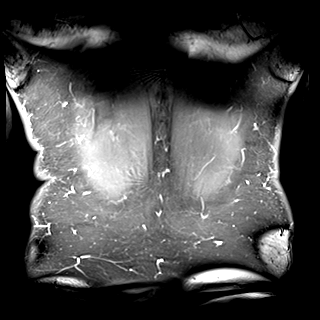
[im 64/64]
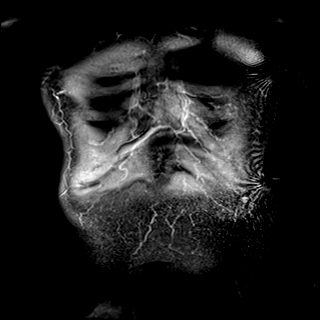

[Series 24: T1 dynamic post-contrast · axial · 3.0mm · 1.25mm/px · z∈[-142,+95]mm · 3 of 80 slices shown (8 of 9)]
[im 1/80]
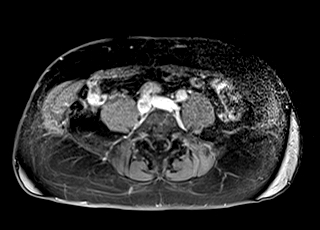
[im 40/80]
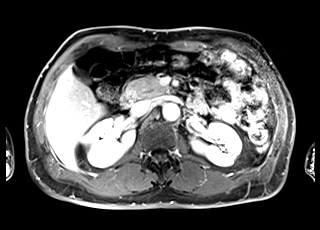
[im 80/80]
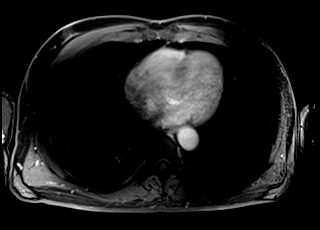

[Series 25: T1 dynamic post-contrast · axial · 3.0mm · 1.25mm/px · z∈[-142,+95]mm · 3 of 80 slices shown (9 of 9)]
[im 1/80]
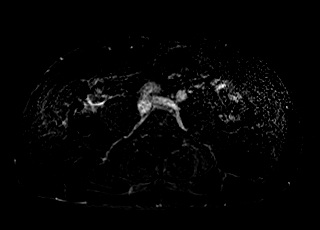
[im 40/80]
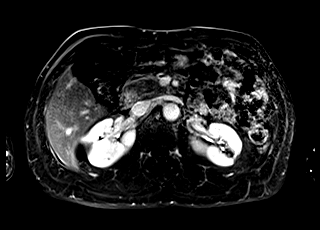
[im 80/80]
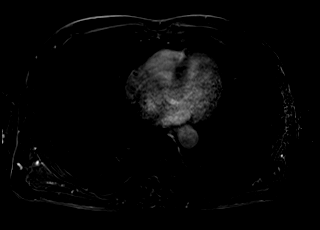

[47 of 48 positions shown; findings below may reference images not displayed]

FINDINGS: Lower chest: No acute findings.

Hepatobiliary: No hepatic masses identified. Gallbladder is
unremarkable. No evidence of biliary ductal dilatation.

Pancreas: No mass or inflammatory changes. No evidence of pancreatic
dilatation or pancreas divisum.

Spleen:  Within normal limits in size and appearance.

Adrenals/Urinary Tract: Normal adrenal glands. Mild scarring and
small hemorrhagic cyst are noted in the lateral midpole of the left
kidney. 2.2 cm benign-appearing subcapsular cyst noted in the upper
pole of left kidney. 3.5 cm simple benign-appearing subcapsular cyst
is seen in the lateral midpole of the right kidney. No solid renal
masses identified. No evidence of hydronephrosis.

Stomach/Bowel: Visualized portion unremarkable.

Vascular/Lymphatic: No pathologically enlarged lymph nodes
identified. No abdominal aortic aneurysm.

Other:  None.

Musculoskeletal:  No suspicious bone lesions identified.
IMPRESSION: No evidence of pancreatic mass or other significant abnormality.

## 2020-09-13 MED ORDER — GADOBENATE DIMEGLUMINE 529 MG/ML IV SOLN
17.0000 mL | Freq: Once | INTRAVENOUS | Status: AC | PRN
  Administered 2020-09-13: 17 mL via INTRAVENOUS

## 2020-09-16 DIAGNOSIS — M5432 Sciatica, left side: Secondary | ICD-10-CM | POA: Diagnosis not present

## 2020-09-16 DIAGNOSIS — Z800 Family history of malignant neoplasm of digestive organs: Secondary | ICD-10-CM | POA: Diagnosis not present

## 2020-09-17 DIAGNOSIS — Z23 Encounter for immunization: Secondary | ICD-10-CM | POA: Diagnosis not present

## 2020-12-03 DIAGNOSIS — M5432 Sciatica, left side: Secondary | ICD-10-CM | POA: Diagnosis not present

## 2020-12-03 DIAGNOSIS — I10 Essential (primary) hypertension: Secondary | ICD-10-CM | POA: Diagnosis not present

## 2020-12-03 DIAGNOSIS — Z1159 Encounter for screening for other viral diseases: Secondary | ICD-10-CM | POA: Diagnosis not present

## 2020-12-03 DIAGNOSIS — Z8042 Family history of malignant neoplasm of prostate: Secondary | ICD-10-CM | POA: Diagnosis not present

## 2020-12-03 DIAGNOSIS — Z1389 Encounter for screening for other disorder: Secondary | ICD-10-CM | POA: Diagnosis not present

## 2020-12-03 DIAGNOSIS — E7800 Pure hypercholesterolemia, unspecified: Secondary | ICD-10-CM | POA: Diagnosis not present

## 2020-12-03 DIAGNOSIS — M5030 Other cervical disc degeneration, unspecified cervical region: Secondary | ICD-10-CM | POA: Diagnosis not present

## 2020-12-03 DIAGNOSIS — Z125 Encounter for screening for malignant neoplasm of prostate: Secondary | ICD-10-CM | POA: Diagnosis not present

## 2020-12-03 DIAGNOSIS — Z0000 Encounter for general adult medical examination without abnormal findings: Secondary | ICD-10-CM | POA: Diagnosis not present

## 2021-02-24 DIAGNOSIS — H52203 Unspecified astigmatism, bilateral: Secondary | ICD-10-CM | POA: Diagnosis not present

## 2021-02-24 DIAGNOSIS — H2513 Age-related nuclear cataract, bilateral: Secondary | ICD-10-CM | POA: Diagnosis not present

## 2021-02-24 DIAGNOSIS — H5213 Myopia, bilateral: Secondary | ICD-10-CM | POA: Diagnosis not present

## 2021-02-24 DIAGNOSIS — H524 Presbyopia: Secondary | ICD-10-CM | POA: Diagnosis not present

## 2021-03-04 DIAGNOSIS — Z23 Encounter for immunization: Secondary | ICD-10-CM | POA: Diagnosis not present

## 2021-06-10 DIAGNOSIS — R202 Paresthesia of skin: Secondary | ICD-10-CM | POA: Diagnosis not present

## 2021-06-10 DIAGNOSIS — I10 Essential (primary) hypertension: Secondary | ICD-10-CM | POA: Diagnosis not present

## 2021-07-23 DIAGNOSIS — L812 Freckles: Secondary | ICD-10-CM | POA: Diagnosis not present

## 2021-07-23 DIAGNOSIS — L708 Other acne: Secondary | ICD-10-CM | POA: Diagnosis not present

## 2021-07-23 DIAGNOSIS — L853 Xerosis cutis: Secondary | ICD-10-CM | POA: Diagnosis not present

## 2021-07-23 DIAGNOSIS — L821 Other seborrheic keratosis: Secondary | ICD-10-CM | POA: Diagnosis not present

## 2021-07-23 DIAGNOSIS — D229 Melanocytic nevi, unspecified: Secondary | ICD-10-CM | POA: Diagnosis not present

## 2021-08-26 DIAGNOSIS — Z23 Encounter for immunization: Secondary | ICD-10-CM | POA: Diagnosis not present

## 2021-09-25 ENCOUNTER — Other Ambulatory Visit: Payer: Self-pay | Admitting: Gastroenterology

## 2021-09-25 DIAGNOSIS — Z800 Family history of malignant neoplasm of digestive organs: Secondary | ICD-10-CM

## 2021-09-25 DIAGNOSIS — N281 Cyst of kidney, acquired: Secondary | ICD-10-CM

## 2021-10-18 ENCOUNTER — Ambulatory Visit
Admission: RE | Admit: 2021-10-18 | Discharge: 2021-10-18 | Disposition: A | Discharge status: Home / Self Care | Payer: Medicare Other | Source: Ambulatory Visit | Attending: Gastroenterology | Admitting: Gastroenterology

## 2021-10-18 DIAGNOSIS — Z800 Family history of malignant neoplasm of digestive organs: Secondary | ICD-10-CM

## 2021-10-18 DIAGNOSIS — N281 Cyst of kidney, acquired: Secondary | ICD-10-CM

## 2021-10-18 DIAGNOSIS — Z8546 Personal history of malignant neoplasm of prostate: Secondary | ICD-10-CM | POA: Diagnosis not present

## 2021-10-18 DIAGNOSIS — Z8507 Personal history of malignant neoplasm of pancreas: Secondary | ICD-10-CM | POA: Diagnosis not present

## 2021-10-18 DIAGNOSIS — I10 Essential (primary) hypertension: Secondary | ICD-10-CM | POA: Diagnosis not present

## 2021-10-18 DIAGNOSIS — Z8042 Family history of malignant neoplasm of prostate: Secondary | ICD-10-CM | POA: Diagnosis not present

## 2021-10-18 IMAGING — MR MR ABDOMEN WO/W CM MRCP
13 of 18 series · 34 of 48 positions shown · IV contrast (Multihance)
Comparison: [DATE]

CLINICAL DATA: Family history of pancreatic and prostate cancer.
History renal cyst. Currently asymptomatic. Hypertension.

EXAM:
MRI ABDOMEN WITHOUT AND WITH CONTRAST (INCLUDING MRCP)
TECHNIQUE: Multiplanar multisequence MR imaging of the abdomen was performed
both before and after the administration of intravenous contrast.
Heavily T2-weighted images of the biliary and pancreatic ducts were
obtained, and three-dimensional MRCP images were rendered by post
processing.
CONTRAST:  15mL MULTIHANCE GADOBENATE DIMEGLUMINE 529 MG/ML IV SOLN

[Series 3: T2 · axial · 6.0mm · 0.78mm/px · z∈[-93,+149]mm · 2 of 36 slices shown (1 of 3)]
[im 1/36]
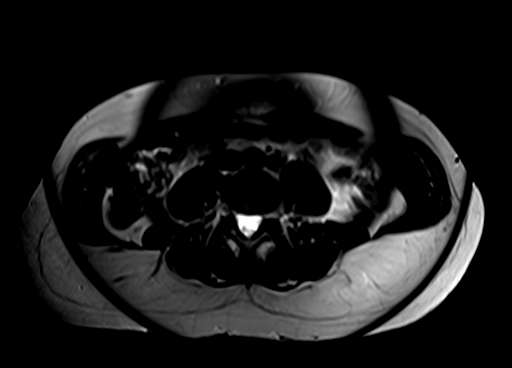
[im 36/36]
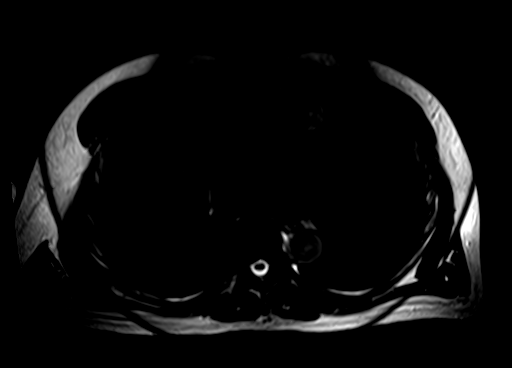

[Series 4: T2 · coronal · 5.0mm · 1.56mm/px · 2 of 35 slices shown (2 of 3)]
[im 1/35]
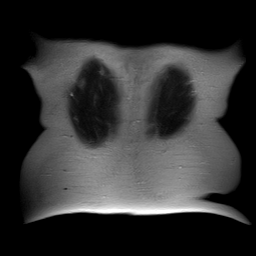
[im 35/35]
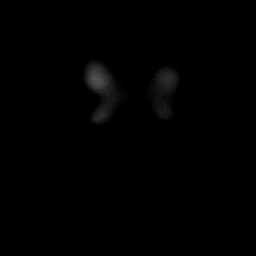

[Series 5: T2 · axial · 6.0mm · 1.56mm/px · z∈[-111,+130]mm · 2 of 36 slices shown (3 of 3)]
[im 1/36]
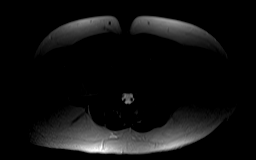
[im 36/36]
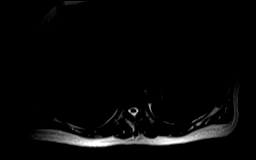

[Series 7: ep2d_diff_b50_500_800_p2 · axial · 6.0mm · 2.08mm/px · z∈[-95,+153]mm · 5 of 111 slices shown]
[im 1/111]
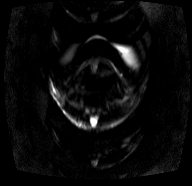
[im 28/111]
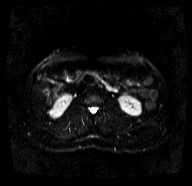
[im 56/111]
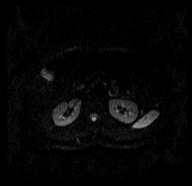
[im 83/111]
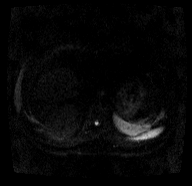
[im 111/111]
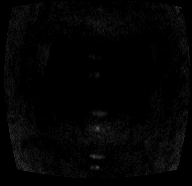

[Series 8: ep2d_diff_b50_500_800_p2_adc · axial · 6.0mm · 2.08mm/px · z∈[-95,+153]mm · 2 of 37 slices shown]
[im 1/37]
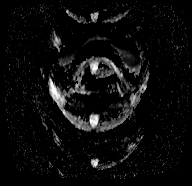
[im 37/37]
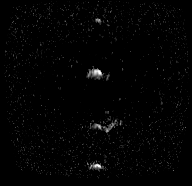

[Series 9: axial tru fisp · axial · 5.0mm · 1.56mm/px · 1 of 40 slices shown]
[im 1/40]
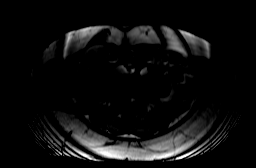

[Series 10: axial in out · axial · 6.0mm · 0.78mm/px · z∈[-99,+135]mm · 2 of 70 slices shown]
[im 1/70]
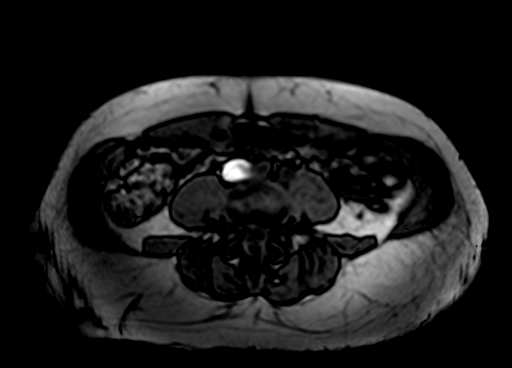
[im 70/70]
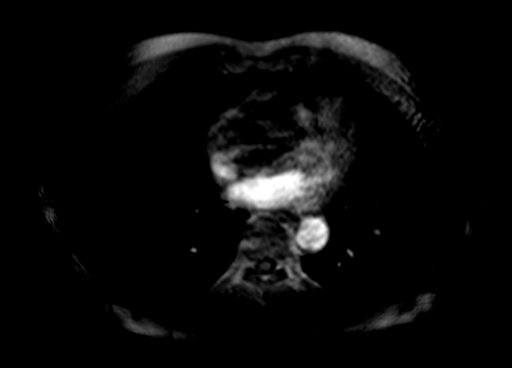

[Series 15: T1 dynamic · axial · non-contrast · 2.5mm · 0.70mm/px · z∈[-101,+136]mm · 3 of 96 slices shown]
[im 1/96]
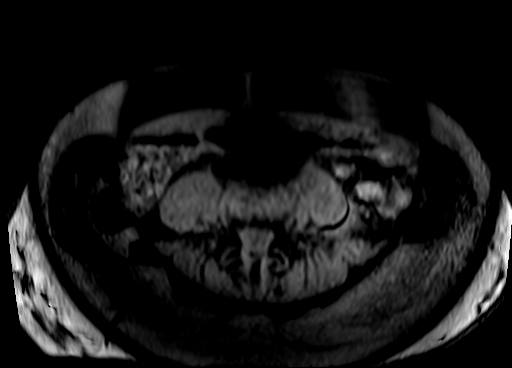
[im 48/96]
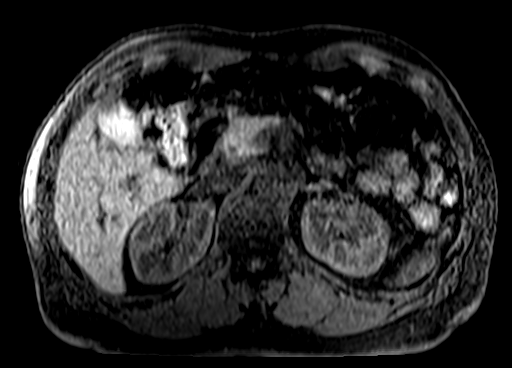
[im 96/96]
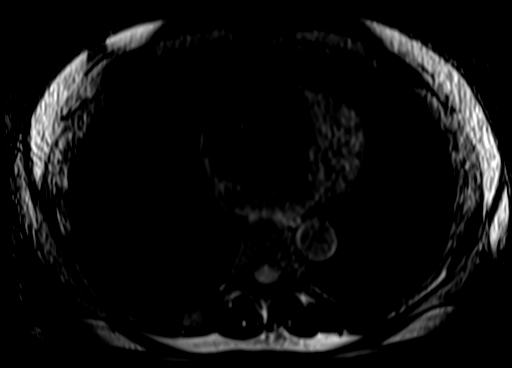

[Series 16: post 25 sec · axial · 2.5mm · 0.70mm/px · z∈[-101,+136]mm · 3 of 96 slices shown]
[im 1/96]
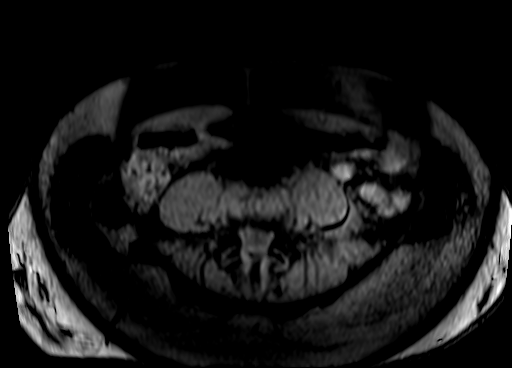
[im 48/96]
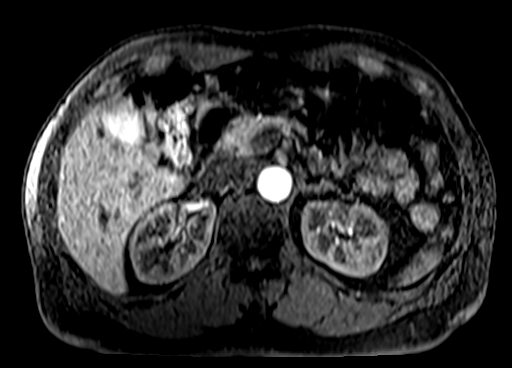
[im 96/96]
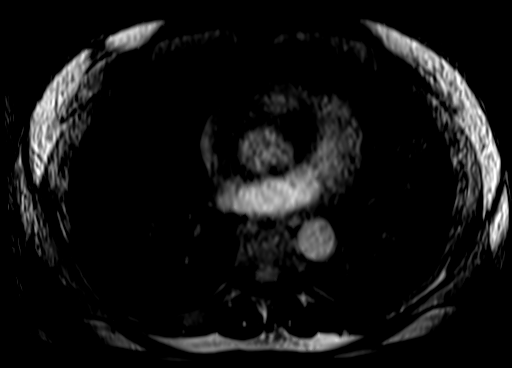

[Series 17: post 25 sec_sub · axial · 2.5mm · 0.70mm/px · z∈[-101,+136]mm · 3 of 96 slices shown]
[im 1/96]
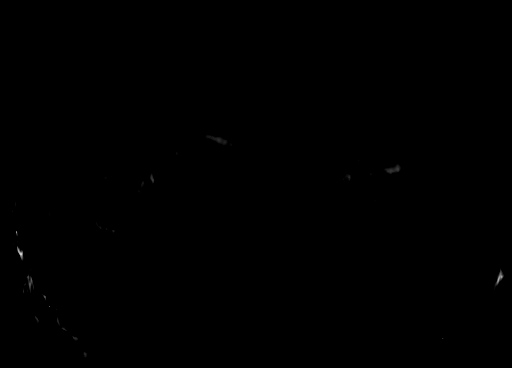
[im 48/96]
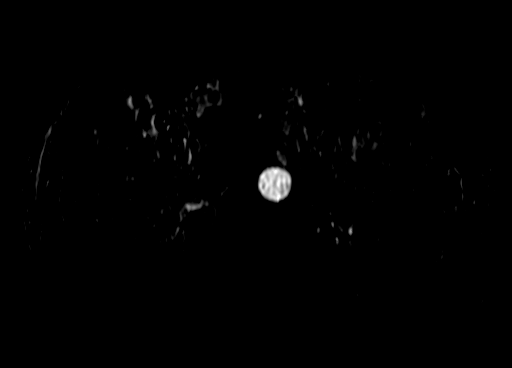
[im 96/96]
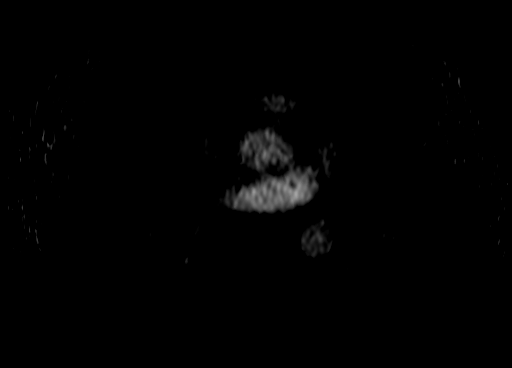

[Series 18: post 45 sec · axial · 2.5mm · 0.70mm/px · z∈[-101,+136]mm · 3 of 96 slices shown]
[im 1/96]
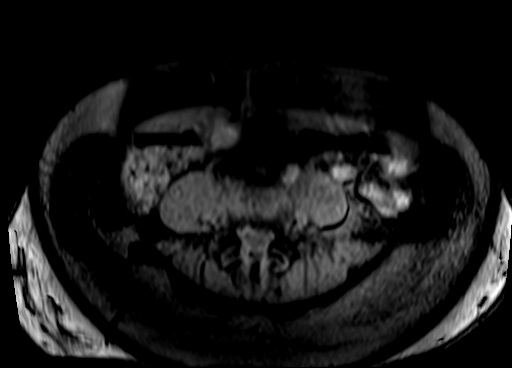
[im 48/96]
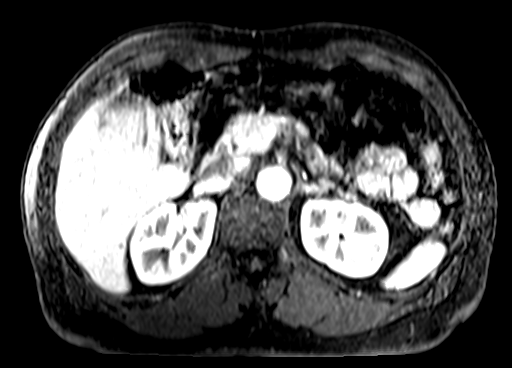
[im 96/96]
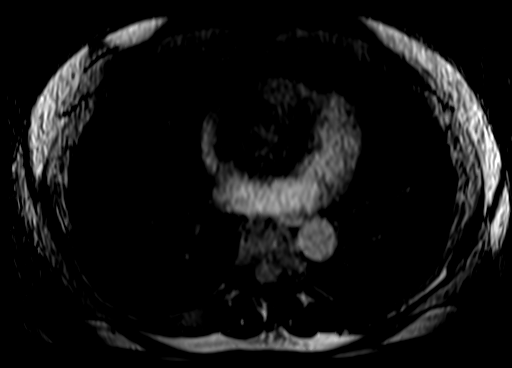

[Series 19: post 45 sec_sub · axial · 2.5mm · 0.70mm/px · z∈[-101,+136]mm · 3 of 96 slices shown]
[im 1/96]
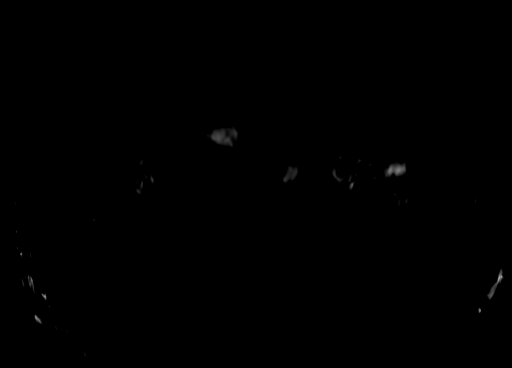
[im 48/96]
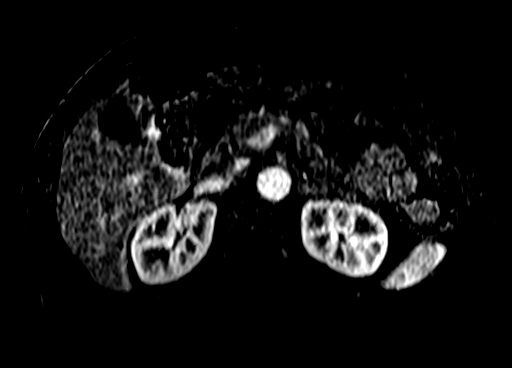
[im 96/96]
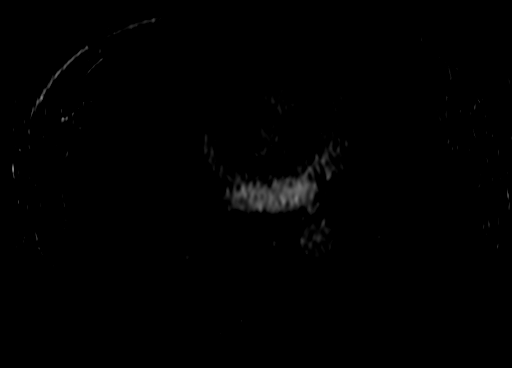

[Series 20: post 90 sec · axial · 2.5mm · 0.70mm/px · z∈[-101,+136]mm · 3 of 96 slices shown]
[im 1/96]
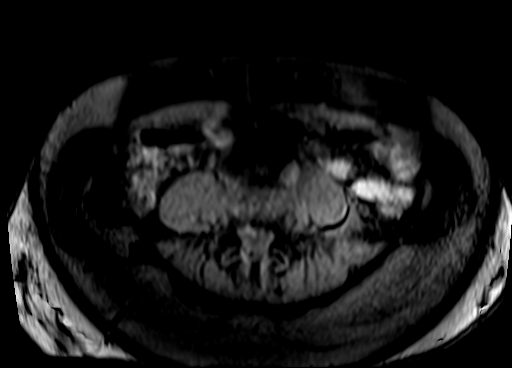
[im 48/96]
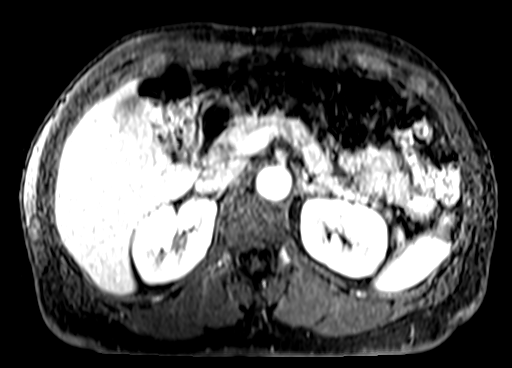
[im 96/96]
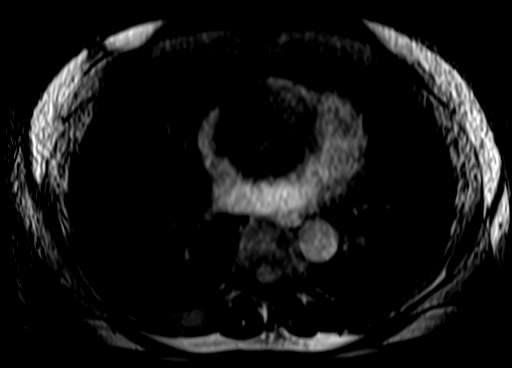

[34 of 48 positions shown; findings below may reference images not displayed]

FINDINGS: Lower chest: Normal heart size without pericardial or pleural
effusion.

Hepatobiliary: Normal liver. Normal gallbladder, without biliary
ductal dilatation.

Pancreas: No evidence of pancreatic mass, duct dilatation, or
surrounding inflammation.

Spleen:  Normal in size, without focal abnormality.

Adrenals/Urinary Tract: Normal adrenal glands. Bilateral simple
renal cysts, right larger than left at up to 3.1 cm.

Interpolar left renal T2 hypointense, T1 hyperintense lesion
including on [DATE] and 55/15. Maximally 1.6 cm on [DATE] versus 1.4 cm
on the prior exam (when remeasured). No post-contrast enhancement.

No hydronephrosis.

Stomach/Bowel: Normal stomach and abdominal bowel loops.

Vascular/Lymphatic: Aortic atherosclerosis. No retroperitoneal or
retrocrural adenopathy.

Other:  No ascites.

Musculoskeletal: No acute osseous abnormality.
IMPRESSION: 1. No evidence of pancreatic mass or other acute abdominal process.
2. Left renal hemorrhagic/proteinaceous cyst, similar to minimally
enlarged. No suspicious renal lesion.
3.  Aortic Atherosclerosis ([54]-[54]).

## 2021-10-18 MED ORDER — GADOBENATE DIMEGLUMINE 529 MG/ML IV SOLN
15.0000 mL | Freq: Once | INTRAVENOUS | Status: AC | PRN
  Administered 2021-10-18: 15 mL via INTRAVENOUS

## 2021-11-09 ENCOUNTER — Other Ambulatory Visit: Payer: Self-pay | Admitting: Internal Medicine

## 2021-11-09 ENCOUNTER — Ambulatory Visit
Admission: RE | Admit: 2021-11-09 | Discharge: 2021-11-09 | Disposition: A | Discharge status: Home / Self Care | Payer: Medicare Other | Source: Ambulatory Visit | Attending: Internal Medicine | Admitting: Internal Medicine

## 2021-11-09 DIAGNOSIS — M25571 Pain in right ankle and joints of right foot: Secondary | ICD-10-CM

## 2021-11-09 DIAGNOSIS — S82831A Other fracture of upper and lower end of right fibula, initial encounter for closed fracture: Secondary | ICD-10-CM | POA: Diagnosis not present

## 2021-11-09 IMAGING — DX DG ANKLE COMPLETE 3+V*R*
3 series · 3 of 3 positions shown · non-contrast
Comparison: None.

CLINICAL DATA: Right ankle pain.  Fall 6 days ago

EXAM:
RIGHT ANKLE - COMPLETE 3+ VIEW

[dg ankle complete right (1 of 3)]
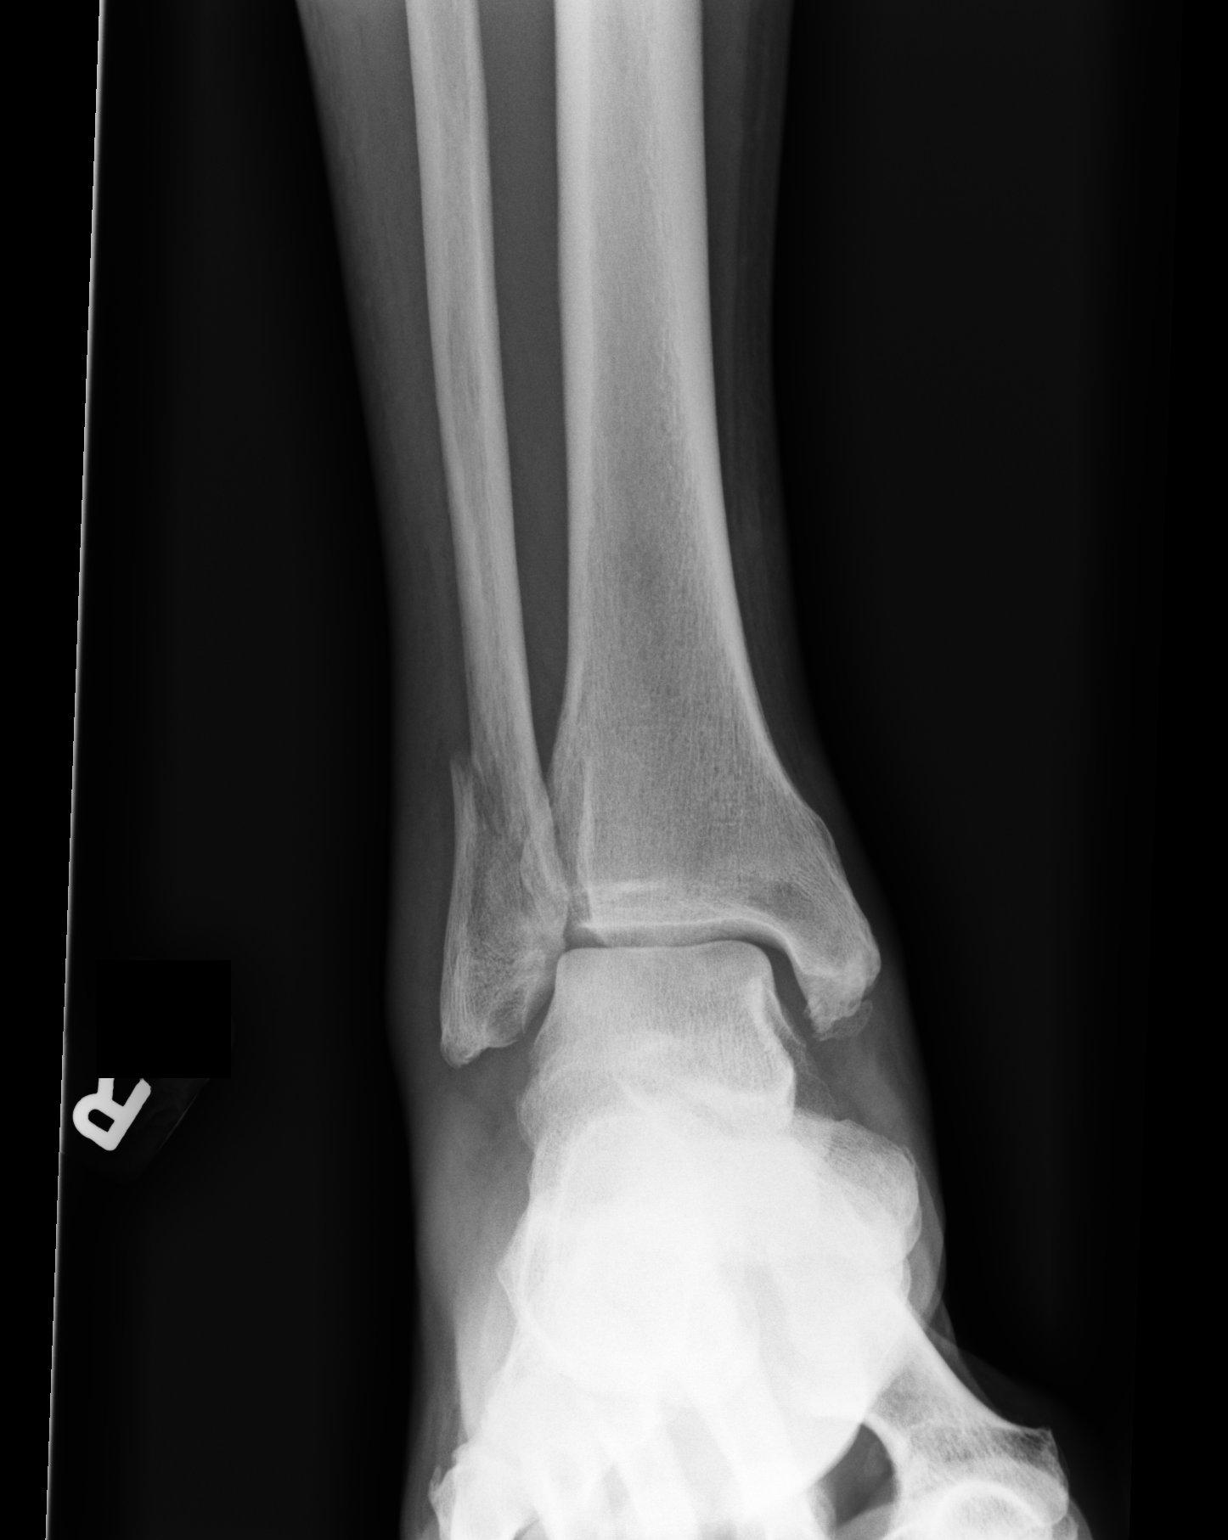

[dg ankle complete right (2 of 3)]
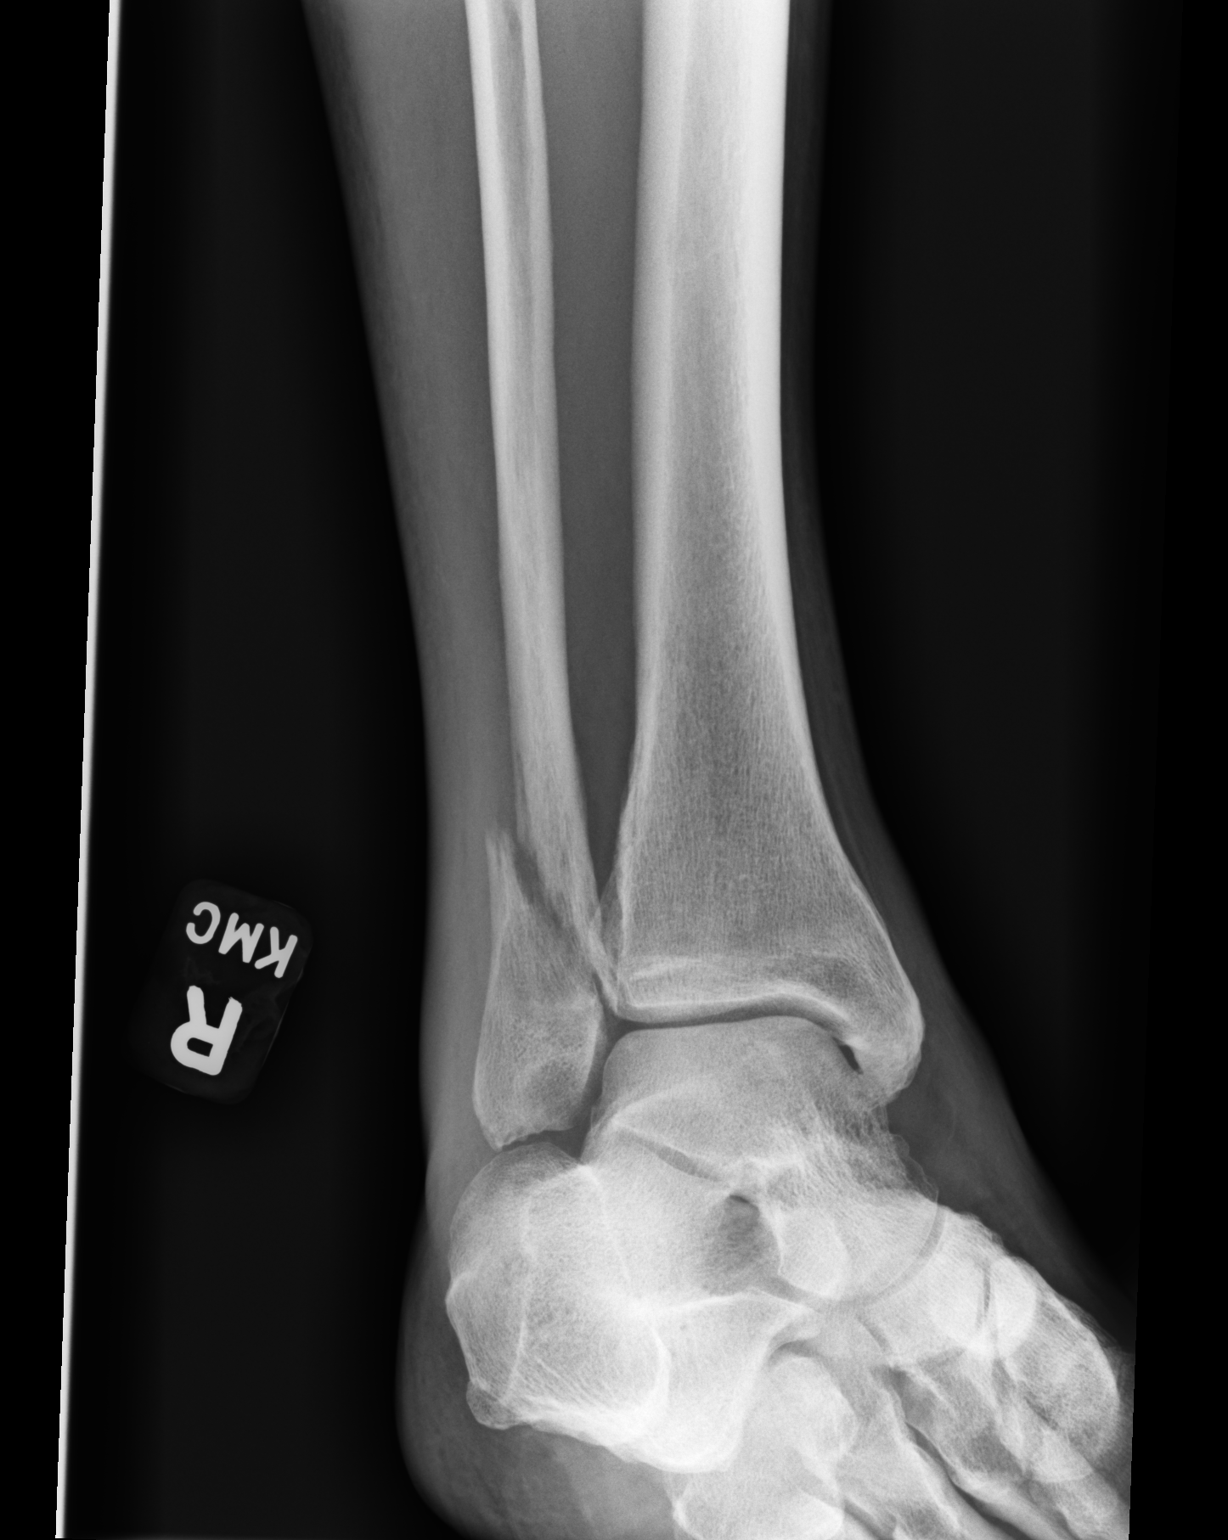

[dg ankle complete right (3 of 3)]
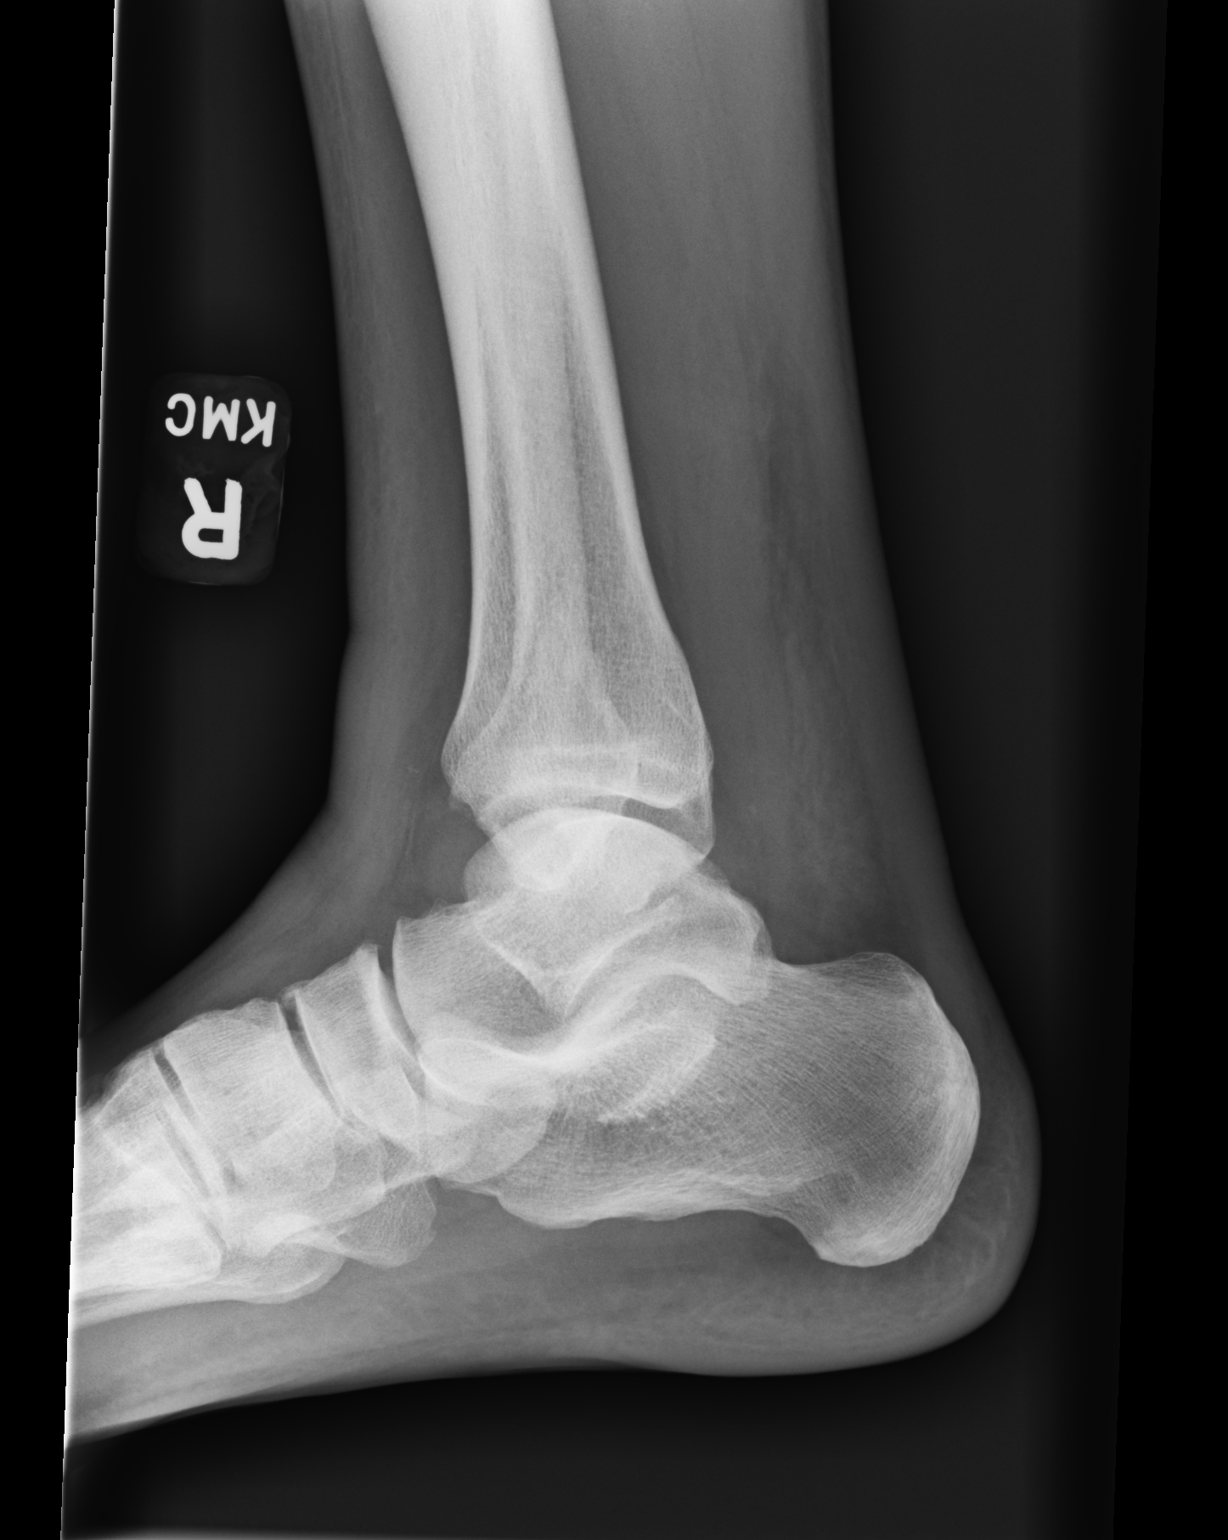

[3 of 3 positions shown; findings below may reference images not displayed]

FINDINGS: There is an oblique fracture through the distal fibula at the level
of the ankle mortise. Mild displacement. No tibial abnormality. No
subluxation or dislocation.
IMPRESSION: Mildly displaced distal right fibular fracture.

## 2021-11-10 DIAGNOSIS — M25571 Pain in right ankle and joints of right foot: Secondary | ICD-10-CM | POA: Diagnosis not present

## 2021-11-16 DIAGNOSIS — M25571 Pain in right ankle and joints of right foot: Secondary | ICD-10-CM | POA: Diagnosis not present

## 2021-11-23 DIAGNOSIS — H25813 Combined forms of age-related cataract, bilateral: Secondary | ICD-10-CM | POA: Diagnosis not present

## 2021-11-23 DIAGNOSIS — H524 Presbyopia: Secondary | ICD-10-CM | POA: Diagnosis not present

## 2021-11-23 DIAGNOSIS — H52203 Unspecified astigmatism, bilateral: Secondary | ICD-10-CM | POA: Diagnosis not present

## 2021-11-23 DIAGNOSIS — H5213 Myopia, bilateral: Secondary | ICD-10-CM | POA: Diagnosis not present

## 2021-11-24 DIAGNOSIS — M25571 Pain in right ankle and joints of right foot: Secondary | ICD-10-CM | POA: Diagnosis not present

## 2021-12-04 DIAGNOSIS — E7800 Pure hypercholesterolemia, unspecified: Secondary | ICD-10-CM | POA: Diagnosis not present

## 2021-12-04 DIAGNOSIS — Z125 Encounter for screening for malignant neoplasm of prostate: Secondary | ICD-10-CM | POA: Diagnosis not present

## 2021-12-04 DIAGNOSIS — Z8042 Family history of malignant neoplasm of prostate: Secondary | ICD-10-CM | POA: Diagnosis not present

## 2021-12-04 DIAGNOSIS — I10 Essential (primary) hypertension: Secondary | ICD-10-CM | POA: Diagnosis not present

## 2021-12-04 DIAGNOSIS — Z86010 Personal history of colonic polyps: Secondary | ICD-10-CM | POA: Diagnosis not present

## 2021-12-04 DIAGNOSIS — Z0000 Encounter for general adult medical examination without abnormal findings: Secondary | ICD-10-CM | POA: Diagnosis not present

## 2021-12-04 DIAGNOSIS — I700 Atherosclerosis of aorta: Secondary | ICD-10-CM | POA: Diagnosis not present

## 2021-12-04 DIAGNOSIS — Z1389 Encounter for screening for other disorder: Secondary | ICD-10-CM | POA: Diagnosis not present

## 2021-12-28 DIAGNOSIS — M25571 Pain in right ankle and joints of right foot: Secondary | ICD-10-CM | POA: Diagnosis not present

## 2022-03-02 DIAGNOSIS — M25571 Pain in right ankle and joints of right foot: Secondary | ICD-10-CM | POA: Diagnosis not present

## 2022-04-21 DIAGNOSIS — Z23 Encounter for immunization: Secondary | ICD-10-CM | POA: Diagnosis not present

## 2022-05-27 DIAGNOSIS — H25813 Combined forms of age-related cataract, bilateral: Secondary | ICD-10-CM | POA: Diagnosis not present

## 2022-06-08 DIAGNOSIS — I10 Essential (primary) hypertension: Secondary | ICD-10-CM | POA: Diagnosis not present

## 2022-06-08 DIAGNOSIS — R002 Palpitations: Secondary | ICD-10-CM | POA: Diagnosis not present

## 2022-06-08 DIAGNOSIS — R42 Dizziness and giddiness: Secondary | ICD-10-CM | POA: Diagnosis not present

## 2022-06-24 DIAGNOSIS — R002 Palpitations: Secondary | ICD-10-CM | POA: Diagnosis not present

## 2022-06-24 DIAGNOSIS — I471 Supraventricular tachycardia: Secondary | ICD-10-CM | POA: Diagnosis not present

## 2022-07-07 DIAGNOSIS — R002 Palpitations: Secondary | ICD-10-CM | POA: Diagnosis not present

## 2022-07-13 DIAGNOSIS — L821 Other seborrheic keratosis: Secondary | ICD-10-CM | POA: Diagnosis not present

## 2022-07-13 DIAGNOSIS — L728 Other follicular cysts of the skin and subcutaneous tissue: Secondary | ICD-10-CM | POA: Diagnosis not present

## 2022-07-13 DIAGNOSIS — D225 Melanocytic nevi of trunk: Secondary | ICD-10-CM | POA: Diagnosis not present

## 2022-07-13 DIAGNOSIS — L814 Other melanin hyperpigmentation: Secondary | ICD-10-CM | POA: Diagnosis not present

## 2022-07-20 DIAGNOSIS — I471 Supraventricular tachycardia: Secondary | ICD-10-CM | POA: Diagnosis not present

## 2022-07-20 DIAGNOSIS — R002 Palpitations: Secondary | ICD-10-CM | POA: Diagnosis not present

## 2022-07-23 ENCOUNTER — Other Ambulatory Visit (HOSPITAL_COMMUNITY): Payer: Self-pay | Admitting: Internal Medicine

## 2022-07-23 DIAGNOSIS — R9431 Abnormal electrocardiogram [ECG] [EKG]: Secondary | ICD-10-CM

## 2022-07-23 DIAGNOSIS — I472 Ventricular tachycardia, unspecified: Secondary | ICD-10-CM

## 2022-08-06 ENCOUNTER — Other Ambulatory Visit (HOSPITAL_COMMUNITY): Payer: Medicare Other

## 2022-08-10 ENCOUNTER — Ambulatory Visit (HOSPITAL_COMMUNITY)
Admission: RE | Admit: 2022-08-10 | Discharge: 2022-08-10 | Disposition: A | Discharge status: Home / Self Care | Payer: Medicare Other | Source: Ambulatory Visit | Attending: Internal Medicine | Admitting: Internal Medicine

## 2022-08-10 DIAGNOSIS — I517 Cardiomegaly: Secondary | ICD-10-CM | POA: Insufficient documentation

## 2022-08-10 DIAGNOSIS — R9431 Abnormal electrocardiogram [ECG] [EKG]: Secondary | ICD-10-CM | POA: Diagnosis not present

## 2022-08-10 DIAGNOSIS — E785 Hyperlipidemia, unspecified: Secondary | ICD-10-CM | POA: Diagnosis not present

## 2022-08-10 DIAGNOSIS — I340 Nonrheumatic mitral (valve) insufficiency: Secondary | ICD-10-CM | POA: Diagnosis not present

## 2022-08-10 DIAGNOSIS — I10 Essential (primary) hypertension: Secondary | ICD-10-CM | POA: Insufficient documentation

## 2022-08-10 LAB — ECHOCARDIOGRAM COMPLETE
AR max vel: 2.75 cm2
AV Peak grad: 10.9 mmHg
Ao pk vel: 1.65 m/s
Area-P 1/2: 3.53 cm2
S' Lateral: 2.6 cm

## 2022-08-31 DIAGNOSIS — Z23 Encounter for immunization: Secondary | ICD-10-CM | POA: Diagnosis not present

## 2022-09-13 DIAGNOSIS — Z23 Encounter for immunization: Secondary | ICD-10-CM | POA: Diagnosis not present

## 2022-09-30 ENCOUNTER — Ambulatory Visit: Payer: Medicare Other | Attending: Cardiology | Admitting: Cardiology

## 2022-09-30 VITALS — BP 118/88 | HR 64 | Ht 71.0 in | Wt 181.0 lb

## 2022-09-30 DIAGNOSIS — R9431 Abnormal electrocardiogram [ECG] [EKG]: Secondary | ICD-10-CM | POA: Diagnosis not present

## 2022-09-30 DIAGNOSIS — Z01818 Encounter for other preprocedural examination: Secondary | ICD-10-CM | POA: Diagnosis not present

## 2022-09-30 DIAGNOSIS — I472 Ventricular tachycardia, unspecified: Secondary | ICD-10-CM | POA: Diagnosis not present

## 2022-09-30 DIAGNOSIS — I471 Supraventricular tachycardia, unspecified: Secondary | ICD-10-CM | POA: Diagnosis not present

## 2022-09-30 MED ORDER — METOPROLOL TARTRATE 50 MG PO TABS: 50.0000 mg | ORAL_TABLET | ORAL | 0 refills | Status: AC

## 2022-09-30 NOTE — Patient Instructions (Addendum)
Medication Instructions:  Please continue your current medications as listed.  *If you need a refill on your cardiac medications before your next appointment, please call your pharmacy*  Lab Work: Please have blood work today. (BMP)  If you have labs (blood work) drawn today and your tests are completely normal, you will receive your results only by: [place] (if you have MyChart) OR A paper copy in the mail If you have any lab test that is abnormal or we need to change your treatment, we will call you to review the results.   Testing/Procedures:   Your cardiac CT will be scheduled at:   [facility] [address] [place], [place] [address] [phone]  Please arrive at the [facility] and Children's Entrance (Entrance C2) of [facility] 30 minutes prior to test start time. You can use the FREE valet parking offered at entrance C (encouraged to control the heart rate for the test)  Proceed to the [facility] Radiology Department (first floor) to check-in and test prep.  All radiology patients and guests should use entrance C2 at [facility], accessed from [facility], even though the hospital's physical address listed is [address]     Please follow these instructions carefully (unless otherwise directed):  On the Night Before the Test: Be sure to Drink plenty of water. Do not consume any caffeinated/decaffeinated beverages or chocolate 12 hours prior to your test. Do not take any antihistamines 12 hours prior to your test.  On the Day of the Test: Drink plenty of water until 1 hour prior to the test. Do not eat any food 1 hour prior to test. You may take your regular medications prior to the test.  Take metoprolol (Lopressor) two hours prior to test. HOLD Furosemide/Hydrochlorothiazide morning of the test.  After the Test: Drink plenty of water. After receiving IV contrast, you may experience a mild flushed  feeling. This is normal. On occasion, you may experience a mild rash up to 24 hours after the test. This is not dangerous. If this occurs, you can take Benadryl 25 mg and increase your fluid intake. If you experience trouble breathing, this can be serious. If it is severe call 911 IMMEDIATELY. If it is mild, please call our office.  We will call to schedule your test 2-4 weeks out understanding that some insurance companies will need an authorization prior to the service being performed.   For non-scheduling related questions, please contact the cardiac imaging nurse navigator should you have any questions/concerns: Marchia Bond, Cardiac Imaging Nurse Navigator Gordy Clement, Cardiac Imaging Nurse Navigator [facility] Direct Office Dial: [phone]   For scheduling needs, including cancellations and rescheduling, please call [place], [phone].   Follow-Up: At [facility], you and your health needs are our priority.  As part of our continuing mission to provide you with exceptional heart care, we have created designated Provider Care Teams.  These Care Teams include your primary Cardiologist (physician) and Advanced Practice Providers (APPs -  Physician Assistants and Nurse Practitioners) who all work together to provide you with the care you need, when you need it.  We recommend signing up for the patient portal called "MyChart".  Sign up information is provided on this After Visit Summary.  MyChart is used to connect with patients for Virtual Visits (Telemedicine).  Patients are able to view lab/test results, encounter notes, upcoming appointments, etc.  Non-urgent messages can be sent to  your provider as well.   To learn more about what you can do with MyChart, go to [email or web address].    Your next appointment:   Follow up will be based on the results of the above testing.   Important Information About Sugar

## 2022-09-30 NOTE — Progress Notes (Signed)
Cardiology Office Note:    Date:  09/30/2022   ID:  Gerald Lynch, DOB 15-Jul-1951, MRN [id number]  PCP:  Kathalene Frames, MD   [facility] Providers Cardiologist:  None     Referring MD: Lavone Orn, MD   History of Present Illness:    Gerald Lynch is a 71 y.o. male here for the evaluation of ventricular tachycardia seen on ZIO monitor and dilation of aorta at the request of Lavone Orn, MD.  He was seen on 08/25/2022 by Lavone Orn, MD. They reviewed the results of his Echocardiogram 08/10/2022 which showed LVEF 50-55%, mild LVH, grade 1 diastolic dysfunction, mild MR, aortic sclerosis, and aortic dilation 40 mm. Proximal septal thickening; chordal SAM but no LVOT gradient at rest.  He was referred to cardiology for further evaluation.  Previously on 06/08/2022, he complained of palpitations and a monitor was ordered to rule out atrial fibrillation. Preliminary monitor results showed predominantly sinus rhythm with 15 runs of SVT, longest 17.8 seconds with average heart rate of 102 bpm.  There was a 10 beat run of ventricular tachycardia noted as well.  Asymptomatic.  He had frequent PACs 6.4% and rare PVCs.  Today, he presents with occasional palpitations; his tachycardic episodes are not very noticeable. We reviewed the results of his echo and monitor at length.  His last LDL was 100. Currently on atorvastatin '10mg'$ .  He stays active by walking 2 miles a day. 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   He denies any smoking. In his family he denies any history of cardiovascular disease.  He denies any chest pain, shortness of breath, or peripheral edema. No lightheadedness, headaches, syncope, orthopnea, or PND.   Past Medical History:  Diagnosis Date   Aortic atherosclerosis ([place])    DDD (degenerative disc disease), cervical    Family history of pancreatic cancer    Family history of prostate cancer    History of adenomatous  polyp of colon    Hypercalcemia    Hyperlipidemia    Hypertension    Kidney cysts    Paresthesia of left leg    Personal history of colonic polyps    Polyp of colon    Sciatica of left side    Seasonal allergic rhinitis due to pollen     Past Surgical History:  Procedure Laterality Date   KNEE ARTHROSCOPY      Current Medications: Current Meds  Medication Sig   aspirin EC 81 MG tablet Take 81 mg by mouth daily.   atorvastatin (LIPITOR) 10 MG tablet Take 10 mg by mouth daily.     Finasteride (PROPECIA PO) Take by mouth daily.     lisinopril (PRINIVIL,ZESTRIL) 10 MG tablet Take 10 mg by mouth daily.     metoprolol tartrate (LOPRESSOR) 50 MG tablet Take 1 tablet (50 mg total) by mouth as directed. Take 1 tablet 2 hours before the CT scan   Multiple Vitamin (ONE-A-DAY MENS PO) Take 1 tablet by mouth daily.   RA KRILL OIL 500 MG CAPS Take 500 mg by mouth daily.     Allergies:   Food   Social History   Socioeconomic History   Marital status: Married    Spouse name: Not on file   Number of children: Not on file   Years of education: Not on file   Highest education level: Not on file  Occupational History   Not on file  Tobacco Use   Smoking status:  Never   Smokeless tobacco: Not on file  Substance and Sexual Activity   Alcohol use: Yes    Comment: 1 drink daily   Drug use: No   Sexual activity: Not on file  Other Topics Concern   Not on file  Social History Narrative   Not on file   Social Determinants of Health   Financial Resource Strain: Not on file  Food Insecurity: Not on file  Transportation Needs: Not on file  Physical Activity: Not on file  Stress: Not on file  Social Connections: Not on file     Family History: The patient's family history includes Hypertension in an other family member; Lung cancer (age of onset: 81) in his brother; Pancreatic cancer (age of onset: 11) in his brother; Pancreatic cancer (age of onset: 37) in his sister; Prostate  cancer (age of onset: 43) in his brother.  ROS:   Please see the history of present illness.    (+) Palpitations All other systems reviewed and are negative.  EKGs/Labs/Other Studies Reviewed:    The following studies were reviewed today:  Echo 08/10/2022: IMPRESSIONS   1. Proximal septal thickening; chordal SAM but no LVOT gradient at rest.   2. Left ventricular ejection fraction, by estimation, is 50 to 55%. The  left ventricle has low normal function. The left ventricle has no regional  wall motion abnormalities. There is mild left ventricular hypertrophy.  Left ventricular diastolic  parameters are consistent with Grade I diastolic dysfunction (impaired  relaxation). Elevated left atrial pressure.   3. Right ventricular systolic function is normal. The right ventricular  size is normal.   4. The mitral valve is normal in structure. Mild mitral valve  regurgitation. No evidence of mitral stenosis.   5. The aortic valve is tricuspid. Aortic valve regurgitation is not  visualized. Aortic valve sclerosis/calcification is present, without any  evidence of aortic stenosis.   6. Aortic dilatation noted. There is mild dilatation of the ascending  aorta, measuring 40 mm.   EKG:  EKG is personally reviewed and interpreted. 09/30/2022: Sinus rhythm. Rate 69 bpm. First degree AV block. 224 ms.   Recent Labs: No results found for requested labs within last 365 days.   Recent Lipid Panel No results found for: "CHOL", "TRIG", "HDL", "CHOLHDL", "VLDL", "LDLCALC", "LDLDIRECT"   Risk Assessment/Calculations:          Physical Exam:    VS:  BP 118/88   Pulse 64   Ht '5\' 11"'$  (1.803 m)   Wt 181 lb (82.1 kg)   SpO2 99%   BMI 25.24 kg/m     Wt Readings from Last 3 Encounters:  09/30/22 181 lb (82.1 kg)  04/08/15 187 lb (84.8 kg)  02/09/15 193 lb (87.5 kg)     GEN: Well nourished, well developed in no acute distress HEENT: Normal NECK: No JVD; No carotid bruits LYMPHATICS:  No lymphadenopathy CARDIAC: RRR, very soft systolic murmur, no rubs or gallops RESPIRATORY:  Clear to auscultation without rales, wheezing or rhonchi  ABDOMEN: Soft, non-tender, non-distended MUSCULOSKELETAL:  No edema; No deformity  SKIN: Warm and dry NEUROLOGIC:  Alert and oriented x 3 PSYCHIATRIC:  Normal affect   ASSESSMENT:    1. Ventricular tachycardia ([place])   2. Pre-procedural examination   3. SVT (supraventricular tachycardia)   4. Abnormal electrocardiogram    PLAN:    In order of problems listed above:  Ventricular tachycardia Palpitations SVT Abnormal EKG - We will go ahead and check a coronary  CT scan with possible FFR analysis given the abnormal EKG/ventricular tachycardia.  My main goal is to exclude the possibility of significant arterial stenosis/plaque. - If plaque is present, we should go ahead and increase his atorvastatin to reduce his LDL to 70.  His current LDL is 100. - Thankfully, echocardiogram shows overall low normal pump function.  He has not had any high risk features such as syncope.  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  CT scan will be helpful.  Noted on echocardiogram 40 mm.  Continue with monitoring.  First-degree AV block - PR interval was 224 ms on ECG.  I think it is still reasonable to give him 50 mg of metoprolol tartrate prior to his CT scan to ensure adequate heart rate and to reduce potential PAC burden.  For long-term use however, we may wish to avoid given further potential for AV blockade.  Follow-up: PRN. We will follow up with the results of testing.  Medication Adjustments/Labs and Tests Ordered: Current medicines are reviewed at length with the patient today.  Concerns regarding medicines are outlined above.   Orders Placed This Encounter  Procedures   CT CORONARY MORPH W/CTA COR W/SCORE W/CA W/CM &/OR WO/CM   Basic metabolic panel   EKG 63-OVFI   Meds ordered this encounter  Medications   metoprolol tartrate (LOPRESSOR) 50 MG  tablet    Sig: Take 1 tablet (50 mg total) by mouth as directed. Take 1 tablet 2 hours before the CT scan    Dispense:  1 tablet    Refill:  0   Patient Instructions  Medication Instructions:  Please continue your current medications as listed.  *If you need a refill on your cardiac medications before your next appointment, please call your pharmacy*  Lab Work: Please have blood work today. (BMP)  If you have labs (blood work) drawn today and your tests are completely normal, you will receive your results only by: [place] (if you have MyChart) OR A paper copy in the mail If you have any lab test that is abnormal or we need to change your treatment, we will call you to review the results.   Testing/Procedures:   Your cardiac CT will be scheduled at:   [facility] [address] [place], [place] [address] [phone]  Please arrive at the [facility] and Children's Entrance (Entrance C2) of [facility] 30 minutes prior to test start time. You can use the FREE valet parking offered at entrance C (encouraged to control the heart rate for the test)  Proceed to the [facility] Radiology Department (first floor) to check-in and test prep.  All radiology patients and guests should use entrance C2 at [facility], accessed from [facility], even though the hospital's physical address listed is [address]     Please follow these instructions carefully (unless otherwise directed):  On the Night Before the Test: Be sure to Drink plenty of water. Do not consume any caffeinated/decaffeinated beverages or chocolate 12 hours prior to your test. Do not take any antihistamines 12 hours prior to your test.  On the Day of the Test: Drink plenty of water until 1 hour prior to the test. Do not eat any food 1 hour prior to test. You may take your regular medications prior to the test.  Take metoprolol (Lopressor) two hours prior to  test. HOLD Furosemide/Hydrochlorothiazide morning of the test.  After the Test: Drink plenty of water. After receiving IV  contrast, you may experience a mild flushed feeling. This is normal. On occasion, you may experience a mild rash up to 24 hours after the test. This is not dangerous. If this occurs, you can take Benadryl 25 mg and increase your fluid intake. If you experience trouble breathing, this can be serious. If it is severe call 911 IMMEDIATELY. If it is mild, please call our office.  We will call to schedule your test 2-4 weeks out understanding that some insurance companies will need an authorization prior to the service being performed.   For non-scheduling related questions, please contact the cardiac imaging nurse navigator should you have any questions/concerns: Marchia Bond, Cardiac Imaging Nurse Navigator Gordy Clement, Cardiac Imaging Nurse Navigator [facility] Direct Office Dial: [phone]   For scheduling needs, including cancellations and rescheduling, please call [place], [phone].   Follow-Up: At [facility], you and your health needs are our priority.  As part of our continuing mission to provide you with exceptional heart care, we have created designated Provider Care Teams.  These Care Teams include your primary Cardiologist (physician) and Advanced Practice Providers (APPs -  Physician Assistants and Nurse Practitioners) who all work together to provide you with the care you need, when you need it.  We recommend signing up for the patient portal called "MyChart".  Sign up information is provided on this After Visit Summary.  MyChart is used to connect with patients for Virtual Visits (Telemedicine).  Patients are able to view lab/test results, encounter notes, upcoming appointments, etc.  Non-urgent messages can be sent to your provider as well.   To learn more about what you can do with MyChart, go to  [email or web address].    Your next appointment:   Follow up will be based on the results of the above testing.   Important Information About Sugar         I,Rachel Rivera,acting as a scribe for Candee Furbish, MD.,have documented all relevant documentation on the behalf of Candee Furbish, MD,as directed by  Candee Furbish, MD while in the presence of Candee Furbish, MD.  I, Candee Furbish, MD, have reviewed all documentation for this visit. The documentation on 09/30/22 for the exam, diagnosis, procedures, and orders are all accurate and complete.   Signed, Candee Furbish, MD  09/30/2022 1:45 PM    [facility]

## 2022-10-01 ENCOUNTER — Other Ambulatory Visit: Payer: Self-pay | Admitting: Cardiology

## 2022-10-01 LAB — BASIC METABOLIC PANEL
BUN/Creatinine Ratio: 13 (ref 10–24)
BUN: 13 mg/dL (ref 8–27)
CO2: 25 mmol/L (ref 20–29)
Calcium: 10.4 mg/dL — ABNORMAL HIGH (ref 8.6–10.2)
Chloride: 104 mmol/L (ref 96–106)
Creatinine, Ser: 0.97 mg/dL (ref 0.76–1.27)
Glucose: 82 mg/dL (ref 70–99)
Potassium: 4.5 mmol/L (ref 3.5–5.2)
Sodium: 142 mmol/L (ref 134–144)
eGFR: 83 mL/min/{1.73_m2} (ref >59)

## 2022-10-13 ENCOUNTER — Telehealth (HOSPITAL_COMMUNITY): Payer: Self-pay | Admitting: *Deleted

## 2022-10-13 NOTE — Telephone Encounter (Signed)
Reaching out to patient to offer assistance regarding upcoming cardiac imaging study; pt verbalizes understanding of appt date/time, parking situation and where to check in, medications ordered, and verified current allergies; name and call back number provided for further questions should they arise  Gordy Clement RN Navigator Cardiac Imaging [facility] [phone] office [phone] cell  Patient to take '50mg'$  metoprolol tartrate two hours prior to his cardiac CT scan and hold his lisinopril. He is aware to arrive at 9am.

## 2022-10-14 ENCOUNTER — Ambulatory Visit (HOSPITAL_COMMUNITY)
Admission: RE | Admit: 2022-10-14 | Discharge: 2022-10-14 | Disposition: A | Discharge status: Home / Self Care | Payer: Medicare Other | Source: Ambulatory Visit | Attending: Cardiology | Admitting: Cardiology

## 2022-10-14 DIAGNOSIS — R9431 Abnormal electrocardiogram [ECG] [EKG]: Secondary | ICD-10-CM | POA: Diagnosis not present

## 2022-10-14 DIAGNOSIS — I471 Supraventricular tachycardia, unspecified: Secondary | ICD-10-CM | POA: Insufficient documentation

## 2022-10-14 MED ORDER — NITROGLYCERIN 0.4 MG SL SUBL
0.8000 mg | SUBLINGUAL_TABLET | Freq: Once | SUBLINGUAL | Status: AC
  Administered 2022-10-14: 0.8 mg via SUBLINGUAL

## 2022-10-14 MED ORDER — NITROGLYCERIN 0.4 MG SL SUBL
SUBLINGUAL_TABLET | SUBLINGUAL | Status: AC
  Filled 2022-10-14: qty 2

## 2022-10-14 MED ORDER — IOHEXOL 350 MG/ML SOLN
100.0000 mL | Freq: Once | INTRAVENOUS | Status: AC | PRN
  Administered 2022-10-14: 100 mL via INTRAVENOUS

## 2022-10-18 ENCOUNTER — Encounter: Payer: Self-pay | Admitting: Cardiology

## 2022-10-19 ENCOUNTER — Telehealth: Payer: Self-pay | Admitting: *Deleted

## 2022-10-19 DIAGNOSIS — Z01818 Encounter for other preprocedural examination: Secondary | ICD-10-CM

## 2022-10-19 DIAGNOSIS — I77810 Thoracic aortic ectasia: Secondary | ICD-10-CM

## 2022-10-19 NOTE — Telephone Encounter (Signed)
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 Pt to be contacted closer to due date to be scheduled.

## 2022-10-20 ENCOUNTER — Other Ambulatory Visit: Payer: Self-pay | Admitting: Gastroenterology

## 2022-10-20 DIAGNOSIS — N281 Cyst of kidney, acquired: Secondary | ICD-10-CM

## 2022-11-20 ENCOUNTER — Ambulatory Visit
Admission: RE | Admit: 2022-11-20 | Discharge: 2022-11-20 | Disposition: A | Discharge status: Home / Self Care | Payer: Medicare Other | Source: Ambulatory Visit | Attending: Gastroenterology | Admitting: Gastroenterology

## 2022-11-20 DIAGNOSIS — N281 Cyst of kidney, acquired: Secondary | ICD-10-CM

## 2022-11-20 MED ORDER — GADOPICLENOL 0.5 MMOL/ML IV SOLN
9.0000 mL | Freq: Once | INTRAVENOUS | Status: AC | PRN
  Administered 2022-11-20: 9 mL via INTRAVENOUS

## 2022-11-23 DIAGNOSIS — H25813 Combined forms of age-related cataract, bilateral: Secondary | ICD-10-CM | POA: Diagnosis not present

## 2023-03-14 DIAGNOSIS — I700 Atherosclerosis of aorta: Secondary | ICD-10-CM | POA: Diagnosis not present

## 2023-03-14 DIAGNOSIS — Z125 Encounter for screening for malignant neoplasm of prostate: Secondary | ICD-10-CM | POA: Diagnosis not present

## 2023-03-14 DIAGNOSIS — E559 Vitamin D deficiency, unspecified: Secondary | ICD-10-CM | POA: Diagnosis not present

## 2023-03-14 DIAGNOSIS — Z79899 Other long term (current) drug therapy: Secondary | ICD-10-CM | POA: Diagnosis not present

## 2023-03-14 DIAGNOSIS — Z0000 Encounter for general adult medical examination without abnormal findings: Secondary | ICD-10-CM | POA: Diagnosis not present

## 2023-03-14 DIAGNOSIS — Z23 Encounter for immunization: Secondary | ICD-10-CM | POA: Diagnosis not present

## 2023-03-14 DIAGNOSIS — Z1331 Encounter for screening for depression: Secondary | ICD-10-CM | POA: Diagnosis not present

## 2023-03-14 DIAGNOSIS — I10 Essential (primary) hypertension: Secondary | ICD-10-CM | POA: Diagnosis not present

## 2023-03-14 DIAGNOSIS — Z86010 Personal history of colonic polyps: Secondary | ICD-10-CM | POA: Diagnosis not present

## 2023-03-14 DIAGNOSIS — Z8042 Family history of malignant neoplasm of prostate: Secondary | ICD-10-CM | POA: Diagnosis not present

## 2023-03-14 DIAGNOSIS — E7800 Pure hypercholesterolemia, unspecified: Secondary | ICD-10-CM | POA: Diagnosis not present

## 2023-03-14 DIAGNOSIS — N529 Male erectile dysfunction, unspecified: Secondary | ICD-10-CM | POA: Diagnosis not present

## 2023-03-14 DIAGNOSIS — I77819 Aortic ectasia, unspecified site: Secondary | ICD-10-CM | POA: Diagnosis not present

## 2023-04-11 DIAGNOSIS — H43392 Other vitreous opacities, left eye: Secondary | ICD-10-CM | POA: Diagnosis not present

## 2023-05-05 DIAGNOSIS — D2271 Melanocytic nevi of right lower limb, including hip: Secondary | ICD-10-CM | POA: Diagnosis not present

## 2023-05-05 DIAGNOSIS — L720 Epidermal cyst: Secondary | ICD-10-CM | POA: Diagnosis not present

## 2023-05-05 DIAGNOSIS — L821 Other seborrheic keratosis: Secondary | ICD-10-CM | POA: Diagnosis not present

## 2023-05-05 DIAGNOSIS — D2262 Melanocytic nevi of left upper limb, including shoulder: Secondary | ICD-10-CM | POA: Diagnosis not present

## 2023-05-05 DIAGNOSIS — D225 Melanocytic nevi of trunk: Secondary | ICD-10-CM | POA: Diagnosis not present

## 2023-05-05 DIAGNOSIS — D1801 Hemangioma of skin and subcutaneous tissue: Secondary | ICD-10-CM | POA: Diagnosis not present

## 2023-05-05 DIAGNOSIS — D2272 Melanocytic nevi of left lower limb, including hip: Secondary | ICD-10-CM | POA: Diagnosis not present

## 2023-05-05 DIAGNOSIS — D2261 Melanocytic nevi of right upper limb, including shoulder: Secondary | ICD-10-CM | POA: Diagnosis not present

## 2023-05-05 DIAGNOSIS — L814 Other melanin hyperpigmentation: Secondary | ICD-10-CM | POA: Diagnosis not present

## 2023-05-09 DIAGNOSIS — Z79899 Other long term (current) drug therapy: Secondary | ICD-10-CM | POA: Diagnosis not present

## 2023-05-09 DIAGNOSIS — E7800 Pure hypercholesterolemia, unspecified: Secondary | ICD-10-CM | POA: Diagnosis not present

## 2023-08-11 ENCOUNTER — Encounter: Payer: Self-pay | Admitting: Podiatrist

## 2023-08-11 ENCOUNTER — Ambulatory Visit (INDEPENDENT_AMBULATORY_CARE_PROVIDER_SITE_OTHER): Payer: Medicare Other | Admitting: Podiatrist

## 2023-08-11 ENCOUNTER — Ambulatory Visit (INDEPENDENT_AMBULATORY_CARE_PROVIDER_SITE_OTHER): Payer: Medicare Other

## 2023-08-11 DIAGNOSIS — M7752 Other enthesopathy of left foot: Secondary | ICD-10-CM

## 2023-08-11 DIAGNOSIS — M7750 Other enthesopathy of unspecified foot: Secondary | ICD-10-CM | POA: Diagnosis not present

## 2023-08-11 MED ORDER — MELOXICAM 15 MG PO TABS: 15.0000 mg | ORAL_TABLET | Freq: Every day | ORAL | 2 refills | Status: AC

## 2023-08-11 NOTE — Progress Notes (Signed)
Chief Complaint  Patient presents with   Foot Pain    5th met base left - aching x couple months, walks daily for exercise and started to become really bothersome, AM pain, tried advil, has some twitching in the big toe- "worsened after drinking caffeine drinks"  1st MPJ right - questions regarding bunions - talked with Dr. Al Corpus years ago about it     HPI: Patient is 72 y.o. male who presents today for pain lateral left foot for the last month.  Took advil for a month and its gotten some better.  The pain is off-and-on at times in his left foot only.  He also relates he spoke with Dr. Al Corpus years ago about his bunion on the right foot.  He wears a silicone spacer and has had no troubles with it.  Patient Active Problem List   Diagnosis Date Noted   Genetic testing 07/08/2020   Family history of pancreatic cancer    Family history of prostate cancer     Current Outpatient Medications on File Prior to Visit  Medication Sig Dispense Refill   BOOSTRIX 5-2.5-18.5 LF-MCG/0.5 injection      lisinopril-hydrochlorothiazide (ZESTORETIC) 20-12.5 MG tablet Take 1 tablet by mouth daily.     potassium chloride (KLOR-CON) 10 MEQ tablet Take 10 mEq by mouth daily.     aspirin EC 81 MG tablet Take 81 mg by mouth daily.     atorvastatin (LIPITOR) 10 MG tablet Take 10 mg by mouth daily.       Finasteride (PROPECIA PO) Take by mouth daily.       metoprolol tartrate (LOPRESSOR) 50 MG tablet Take 1 tablet (50 mg total) by mouth as directed. Take 1 tablet 2 hours before the CT scan 1 tablet 0   Multiple Vitamin (ONE-A-DAY MENS PO) Take 1 tablet by mouth daily.     RA KRILL OIL 500 MG CAPS Take 500 mg by mouth daily.     No current facility-administered medications on file prior to visit.    Allergies  Allergen Reactions   Food Rash    strawberries   Other Rash    strawberries    Review of Systems No fevers, chills, nausea, muscle aches, no difficulty breathing, no calf pain, no chest pain or  shortness of breath.   Physical Exam  GENERAL APPEARANCE: Alert, conversant. Appropriately groomed. No acute distress.   VASCULAR: Pedal pulses palpable 2/4 DP and  PT bilateral.  Capillary refill time is immediate to all digits,  Proximal to distal cooling is warm to warm.  Digital perfusion adequate.   NEUROLOGIC: sensation is intact to 5.07 monofilament at 5/5 sites bilateral.  Light touch is intact bilateral, vibratory sensation intact bilateral  MUSCULOSKELETAL: acceptable muscle strength, tone and stability bilateral.  Prominent fifth metatarsal base left is noted.  Pain on palpation at the insertion of the peroneal tendon on the fifth metatarsal base is noted.  Contracture of digits on the left foot is also noted in comparison with the right.  Right foot has slight lateral deviation of the hallux and bunion deformity noted.   DERMATOLOGIC: skin is warm, supple, and dry.  Color, texture, and turgor of skin within normal limits.  No open wounds are noted.  No preulcerative lesions are seen.  Digital nails are asymptomatic.    X-ray evaluation 3 weightbearing views of the left foot are obtained today on 08/11/2023. No sign of fracture dislocation of the fifth metatarsal base noted.  Light osteophytic spurring noted lateral.  Contracture of lesser digits is seen no acute osseous abnormalities are seen.  Assessment     ICD-10-CM   1. Bursitis of left foot  M77.52 DG Foot Complete Left    2. Tendonitis of ankle or foot  M77.50          Plan  Discussed exam and x-ray findings.  Discussed that they point tenderness area of pain indicates a tendinitis on the fifth metatarsal base.  Recommended an offloading pad as well as anti-inflammatory medications.  Gave the option of a steroid injection, steroid pill pack for anti-inflammatory medication and meloxicam and he elected to try the meloxicam as well as the offloading pad first.  He will call if this fails to relieve his pain for further  treatment of this condition.  Otherwise he will be seen back in the future as needed.

## 2023-08-21 DIAGNOSIS — Z23 Encounter for immunization: Secondary | ICD-10-CM | POA: Diagnosis not present

## 2023-09-06 ENCOUNTER — Encounter (HOSPITAL_COMMUNITY): Payer: Self-pay

## 2023-09-06 ENCOUNTER — Other Ambulatory Visit: Payer: Self-pay

## 2023-09-06 ENCOUNTER — Emergency Department (HOSPITAL_COMMUNITY)
Admission: EM | Admit: 2023-09-06 | Discharge: 2023-09-06 | Disposition: A | Discharge status: Home / Self Care | Payer: Medicare Other | Attending: Emergency Medicine | Admitting: Emergency Medicine

## 2023-09-06 DIAGNOSIS — Z79899 Other long term (current) drug therapy: Secondary | ICD-10-CM | POA: Insufficient documentation

## 2023-09-06 DIAGNOSIS — I10 Essential (primary) hypertension: Secondary | ICD-10-CM | POA: Diagnosis not present

## 2023-09-06 LAB — BASIC METABOLIC PANEL
Anion gap: 7 (ref 5–15)
BUN: 15 mg/dL (ref 8–23)
CO2: 28 mmol/L (ref 22–32)
Calcium: 9.7 mg/dL (ref 8.9–10.3)
Chloride: 104 mmol/L (ref 98–111)
Creatinine, Ser: 1.05 mg/dL (ref 0.61–1.24)
GFR, Estimated: >60 mL/min (ref >60)
Glucose, Bld: 138 mg/dL — ABNORMAL HIGH (ref 70–99)
Potassium: 3.8 mmol/L (ref 3.5–5.1)
Sodium: 139 mmol/L (ref 135–145)

## 2023-09-06 LAB — CBC
HCT: 44.7 % (ref 39.0–52.0)
Hemoglobin: 14.5 g/dL (ref 13.0–17.0)
MCH: 30.5 pg (ref 26.0–34.0)
MCHC: 32.4 g/dL (ref 30.0–36.0)
MCV: 94.1 fL (ref 80.0–100.0)
Platelets: 235 10*3/uL (ref 150–400)
RBC: 4.75 MIL/uL (ref 4.22–5.81)
RDW: 14 % (ref 11.5–15.5)
WBC: 10.9 10*3/uL — ABNORMAL HIGH (ref 4.0–10.5)
nRBC: 0 % (ref 0.0–0.2)

## 2023-09-06 LAB — TROPONIN I (HIGH SENSITIVITY): Troponin I (High Sensitivity): 7 ng/L (ref <18)

## 2023-09-06 MED ORDER — LISINOPRIL-HYDROCHLOROTHIAZIDE 20-12.5 MG PO TABS: 2.0000 | ORAL_TABLET | Freq: Every day | ORAL | 0 refills | Status: DC

## 2023-09-06 NOTE — ED Triage Notes (Addendum)
Pt to ED with complaint of HTN that began this evening. Reports feeling "jittery" when noticed elevated BP of 230/130. Took an extra 20mg  lisinopril PTA. Denies CP, SHOB, dizziness, HA or blurred vision.

## 2023-09-06 NOTE — Discharge Instructions (Addendum)
We evaluated you for your high blood pressure.  Your laboratory testing was reassuring and your physical exam was normal.  I am not sure why your blood pressure spiked but since you are not having any dangerous symptoms, your lab tests are reassuring, and your examination is reassuring, I believe it is safe to go home.  I have increased the dose of your blood pressure medication.  Please follow-up closely with your primary care doctor.  He may need a lab recheck since you have an increased dose of diuretic.  Please continue to keep a log of your blood pressures.  If you do develop any new symptoms such as severe headaches, numbness or tingling, weakness, trouble walking, dizziness, trouble swallowing, vision changes, chest pain, shortness of breath, abdominal pain, leg swelling, urinary changes, difficulty breathing, or any other new symptoms, please return immediately to the emergency department for reassessment.

## 2023-09-06 NOTE — ED Provider Notes (Signed)
[place] EMERGENCY DEPARTMENT AT [facility] Provider Note  CSN: [id number] Arrival date & time: 09/06/23 0120  Chief Complaint(s) Hypertension  HPI Satvik Wetherby is a 72 y.o. male history of hyperlipidemia, hypertension presenting to the emergency department with high blood pressure.  Patient reports he normally checks his blood pressure twice daily.  Checked it last evening, was 230/130.  He reports feeling some "jitteriness" but otherwise really denies any symptoms at all such as chest pain, headaches, nausea or vomiting, abdominal pain, difficulty breathing, leg swelling, numbness, tingling, weakness, dizziness, fevers or chills, back pain, lightheadedness, or any other new symptoms.  He took an extra dose of his blood pressure medication at home.   Past Medical History Past Medical History:  Diagnosis Date   Aortic atherosclerosis (HCC)    DDD (degenerative disc disease), cervical    Family history of pancreatic cancer    Family history of prostate cancer    History of adenomatous polyp of colon    Hypercalcemia    Hyperlipidemia    Hypertension    Kidney cysts    Paresthesia of left leg    Personal history of colonic polyps    Polyp of colon    Sciatica of left side    Seasonal allergic rhinitis due to pollen    Patient Active Problem List   Diagnosis Date Noted   Genetic testing 07/08/2020   Family history of pancreatic cancer    Family history of prostate cancer    Home Medication(s) Prior to Admission medications   Medication Sig Start Date End Date Taking? Authorizing Provider  aspirin EC 81 MG tablet Take 81 mg by mouth daily.    [provider]  atorvastatin (LIPITOR) 10 MG tablet Take 10 mg by mouth daily.      [provider]  BOOSTRIX 5-2.5-18.5 LF-MCG/0.5 injection  03/14/23   [provider]  Finasteride (PROPECIA PO) Take by mouth daily.      [provider]  lisinopril-hydrochlorothiazide (ZESTORETIC) 20-12.5  MG tablet Take 2 tablets by mouth daily. 09/06/23   Lonell Grandchild, MD  meloxicam (MOBIC) 15 MG tablet Take 1 tablet (15 mg total) by mouth daily. Eat with food. 08/11/23   Delories Heinz, DPM  metoprolol tartrate (LOPRESSOR) 50 MG tablet Take 1 tablet (50 mg total) by mouth as directed. Take 1 tablet 2 hours before the CT scan 09/30/22   Jake Bathe, MD  Multiple Vitamin (ONE-A-DAY MENS PO) Take 1 tablet by mouth daily.    [provider]  potassium chloride (KLOR-CON) 10 MEQ tablet Take 10 mEq by mouth daily. 06/01/23   [provider]  RA KRILL OIL 500 MG CAPS Take 500 mg by mouth daily.    [provider]  Past Surgical History Past Surgical History:  Procedure Laterality Date   KNEE ARTHROSCOPY     Family History Family History  Problem Relation Age of Onset   Hypertension Other    Pancreatic cancer Sister 12   Lung cancer Brother 79   Prostate cancer Brother 56   Pancreatic cancer Brother 69    Social History Social History   Tobacco Use   Smoking status: Never  Substance Use Topics   Alcohol use: Yes    Comment: 1 drink daily   Drug use: No   Allergies Food and Other  Review of Systems Review of Systems  All other systems reviewed and are negative.   Physical Exam Vital Signs  I have reviewed the triage vital signs BP (!) 185/98   Pulse 70   Temp 97.9 F (36.6 C)   Resp 15   SpO2 100%  Physical Exam Vitals and nursing note reviewed.  Constitutional:      General: He is not in acute distress.    Appearance: Normal appearance.  HENT:     Mouth/Throat:     Mouth: Mucous membranes are moist.  Eyes:     Conjunctiva/sclera: Conjunctivae normal.  Cardiovascular:     Rate and Rhythm: Normal rate and regular rhythm.     Pulses:          Radial pulses are 2+ on the right side and 2+ on the left  side.  Pulmonary:     Effort: Pulmonary effort is normal. No respiratory distress.     Breath sounds: Normal breath sounds.  Abdominal:     General: Abdomen is flat.     Palpations: Abdomen is soft.     Tenderness: There is no abdominal tenderness.  Musculoskeletal:     Right lower leg: No edema.     Left lower leg: No edema.  Skin:    General: Skin is warm and dry.     Capillary Refill: Capillary refill takes less than 2 seconds.  Neurological:     Mental Status: He is alert and oriented to person, place, and time. Mental status is at baseline.     Comments: Cranial nerves II through XII intact, strength 5 out of 5 in the bilateral upper and lower extremities, no sensory deficit to light touch, no dysmetria on finger-nose-finger testing  Psychiatric:        Mood and Affect: Mood normal.        Behavior: Behavior normal.     ED Results and Treatments Labs (all labs ordered are listed, but only abnormal results are displayed) Labs Reviewed  BASIC METABOLIC PANEL - Abnormal; Notable for the following components:      Result Value   Glucose, Bld 138 (*)    All other components within normal limits  CBC - Abnormal; Notable for the following components:   WBC 10.9 (*)    All other components within normal limits  TROPONIN I (HIGH SENSITIVITY)  Radiology No results found.  Pertinent labs & imaging results that were available during my care of the patient were reviewed by me and considered in my medical decision making (see MDM for details).  Medications Ordered in ED Medications - No data to display                                                                                                                                   Procedures Procedures  (including critical care time)  Medical Decision Making / ED Course   MDM:  72 year old presenting to  the emergency department with high blood pressure.  Patient well-appearing, physical exam reassuring, no focal abnormality appreciated including neurologic exam.  Unclear cause of elevated blood pressure, but patient has no concerning symptoms, labs reassuring, troponin negative more than 2 hours after onset of symptoms.  He does have follow-up with his primary care doctor in 1 week.  Will increase his home blood pressure dose.  Very low concern for underlying medical issues such as intracranial process, stroke, cardiac issue or dissection, or any other acute issue.  Discussed with the patient that if he does develop any new medical symptoms he should definitely return to the emergency department for reevaluation and since we are increasing his diuretic dose he may need repeat labs and he should touch base with his primary doctor later today. Will discharge patient to home. All questions answered. Patient comfortable with plan of discharge. Return precautions discussed with patient and specified on the after visit summary.       Additional history obtained: -Additional history obtained from spouse -External records from outside source obtained and reviewed including: Chart review including previous notes, labs, imaging, consultation notes including prior notes   Lab Tests: -I ordered, reviewed, and interpreted labs.   The pertinent results include:   Labs Reviewed  BASIC METABOLIC PANEL - Abnormal; Notable for the following components:      Result Value   Glucose, Bld 138 (*)    All other components within normal limits  CBC - Abnormal; Notable for the following components:   WBC 10.9 (*)    All other components within normal limits  TROPONIN I (HIGH SENSITIVITY)    Notable for nonspecific leukocytosis, normal troponin, minimal hyperglycemia   EKG   EKG Interpretation Date/Time:  Tuesday September 06 2023 01:12:55 EDT Ventricular Rate:  78 PR Interval:  238 QRS Duration:  114 QT  Interval:  374 QTC Calculation: 426 R Axis:   -37  Text Interpretation: Sinus rhythm with 1st degree A-V block Left axis deviation Moderate voltage criteria for LVH, may be normal variant ( R in aVL , Cornell product ) Cannot rule out Anterior infarct , age undetermined Abnormal ECG Confirmed by Alvino Blood ([id number]) on 09/06/2023 7:44:58 AM          Medicines ordered and prescription drug management: Meds ordered this encounter  Medications   lisinopril-hydrochlorothiazide (ZESTORETIC)  20-12.5 MG tablet    Sig: Take 2 tablets by mouth daily.    Dispense:  60 tablet    Refill:  0    -I have reviewed the patients home medicines and have made adjustments as needed   Cardiac Monitoring: The patient was maintained on a cardiac monitor.  I personally viewed and interpreted the cardiac monitored which showed an underlying rhythm of: NSR   Reevaluation: After the interventions noted above, I reevaluated the patient and found that their symptoms have improved  Co morbidities that complicate the patient evaluation  Past Medical History:  Diagnosis Date   Aortic atherosclerosis (HCC)    DDD (degenerative disc disease), cervical    Family history of pancreatic cancer    Family history of prostate cancer    History of adenomatous polyp of colon    Hypercalcemia    Hyperlipidemia    Hypertension    Kidney cysts    Paresthesia of left leg    Personal history of colonic polyps    Polyp of colon    Sciatica of left side    Seasonal allergic rhinitis due to pollen       Dispostion: Disposition decision including need for hospitalization was considered, and patient discharged from emergency department.    Final Clinical Impression(s) / ED Diagnoses Final diagnoses:  Hypertension, unspecified type     This chart was dictated using voice recognition software.  Despite best efforts to proofread,  errors can occur which can change the documentation meaning.    Lonell Grandchild, MD 09/06/23 [phone]

## 2023-09-16 DIAGNOSIS — H2513 Age-related nuclear cataract, bilateral: Secondary | ICD-10-CM | POA: Diagnosis not present

## 2023-09-16 DIAGNOSIS — I10 Essential (primary) hypertension: Secondary | ICD-10-CM | POA: Diagnosis not present

## 2023-09-20 DIAGNOSIS — M25562 Pain in left knee: Secondary | ICD-10-CM | POA: Diagnosis not present

## 2023-09-21 DIAGNOSIS — E7800 Pure hypercholesterolemia, unspecified: Secondary | ICD-10-CM | POA: Diagnosis not present

## 2023-09-21 DIAGNOSIS — Z860101 Personal history of adenomatous and serrated colon polyps: Secondary | ICD-10-CM | POA: Diagnosis not present

## 2023-09-21 DIAGNOSIS — I10 Essential (primary) hypertension: Secondary | ICD-10-CM | POA: Diagnosis not present

## 2023-09-21 DIAGNOSIS — R252 Cramp and spasm: Secondary | ICD-10-CM | POA: Diagnosis not present

## 2023-09-21 DIAGNOSIS — I77819 Aortic ectasia, unspecified site: Secondary | ICD-10-CM | POA: Diagnosis not present

## 2023-09-21 DIAGNOSIS — I700 Atherosclerosis of aorta: Secondary | ICD-10-CM | POA: Diagnosis not present

## 2023-09-21 DIAGNOSIS — E559 Vitamin D deficiency, unspecified: Secondary | ICD-10-CM | POA: Diagnosis not present

## 2023-09-26 ENCOUNTER — Ambulatory Visit
Admission: RE | Admit: 2023-09-26 | Discharge: 2023-09-26 | Disposition: A | Discharge status: Home / Self Care | Payer: Medicare Other | Source: Ambulatory Visit | Attending: Cardiology | Admitting: Cardiology

## 2023-09-26 DIAGNOSIS — I77810 Thoracic aortic ectasia: Secondary | ICD-10-CM

## 2023-09-26 DIAGNOSIS — Z01818 Encounter for other preprocedural examination: Secondary | ICD-10-CM

## 2023-09-26 DIAGNOSIS — I7121 Aneurysm of the ascending aorta, without rupture: Secondary | ICD-10-CM | POA: Diagnosis not present

## 2023-09-26 MED ORDER — IOPAMIDOL (ISOVUE-370) INJECTION 76%
500.0000 mL | Freq: Once | INTRAVENOUS | Status: AC | PRN
  Administered 2023-09-26: 75 mL via INTRAVENOUS

## 2023-10-03 ENCOUNTER — Ambulatory Visit: Payer: Medicare Other | Attending: Cardiology | Admitting: Cardiology

## 2023-10-03 ENCOUNTER — Encounter: Payer: Self-pay | Admitting: Cardiology

## 2023-10-03 VITALS — BP 104/72 | HR 52 | Ht 72.0 in | Wt 180.8 lb

## 2023-10-03 DIAGNOSIS — R9431 Abnormal electrocardiogram [ECG] [EKG]: Secondary | ICD-10-CM | POA: Insufficient documentation

## 2023-10-03 DIAGNOSIS — E041 Nontoxic single thyroid nodule: Secondary | ICD-10-CM | POA: Diagnosis not present

## 2023-10-03 DIAGNOSIS — I77810 Thoracic aortic ectasia: Secondary | ICD-10-CM | POA: Diagnosis not present

## 2023-10-03 NOTE — Patient Instructions (Addendum)
Medication Instructions:  The current medical regimen is effective;  continue present plan and medications.  *If you need a refill on your cardiac medications before your next appointment, please call your pharmacy*  Testing/Procedures: Your physician has requested that you have an echocardiogram in 1 year. Echocardiography is a painless test that uses sound waves to create images of your heart. It provides your doctor with information about the size and shape of your heart and how well your heart's chambers and valves are working. This procedure takes approximately one hour. There are no restrictions for this procedure. Please do NOT wear cologne, perfume, aftershave, or lotions (deodorant is allowed). Please arrive 15 minutes prior to your appointment time.  You have been referred to Endocrinology for thyroid nodule.  Follow-Up: At [facility], you and your health needs are our priority.  As part of our continuing mission to provide you with exceptional heart care, we have created designated Provider Care Teams.  These Care Teams include your primary Cardiologist (physician) and Advanced Practice Providers (APPs -  Physician Assistants and Nurse Practitioners) who all work together to provide you with the care you need, when you need it.  We recommend signing up for the patient portal called "MyChart".  Sign up information is provided on this After Visit Summary.  MyChart is used to connect with patients for Virtual Visits (Telemedicine).  Patients are able to view lab/test results, encounter notes, upcoming appointments, etc.  Non-urgent messages can be sent to your provider as well.   To learn more about what you can do with MyChart, go to [email or web address].    Your next appointment:   1 year(s)  Provider:   Dr Anne Fu

## 2023-10-03 NOTE — Progress Notes (Signed)
Cardiology Office Note:  .   Date:  10/03/2023  ID:  Gerald Lynch, DOB 1951-03-21, MRN [id number] PCP: Emilio Aspen, MD  [facility] Providers Cardiologist:  None     History of Present Illness: Gerald Lynch   Gerald Lynch is a 72 y.o. male Discussed the use of AI scribe software   History of Present Illness   The patient, a 72 year old individual with a history of aneurysmal dilatation of the ascending aorta, presented for a follow-up evaluation. The patient's aorta was previously measured at 4.2 cm, and the most recent scan reported a measurement of 4.4 cm. However, upon review of the scan, the patient's aorta was measured at 3.9 x 4.0 cm in double oblique, indicating a mild dilation. The patient's previous echocardiogram showed an EF of 50-55%, mild LVH, grade one diastolic dysfunction, and mild MR, aorta 40 mm. The patient also had a Zio monitor that showed frequent PACs but no evidence of atrial fibrillation.  The patient is currently on atorvastatin 10 mg, with an LDL of 107. A recent issue arose with the patient's blood pressure spiking to 200/105, which led to an increase in his Lisinopril dosage from 20 mg to 40 mg and the addition of amlodipine 2.5 mg. Since the adjustment, the patient's blood pressure has stabilized.  Additionally, a new finding of a nodular nodule in the right lobe of the thyroid gland measuring 3.75cm was reported. This finding was not present in the previous year's report because of window of aquisition and has not been evaluated before. The patient will be referred to an endocrinologist for further evaluation.       ROS: no cp.   Studies Reviewed: .        Results LABS LDL: 107  RADIOLOGY Aorta CT scan: Ascending aorta 3.9 x 4.0 cm Thyroid ultrasound: Heterogeneous nodule in right lobe of thyroid gland measuring 3.75 cm  DIAGNOSTIC Echocardiogram: EF 50-55%, mild LVH, grade 1 diastolic dysfunction, mild MR, aorta 40 mm (08/10/2022) Zio  monitor: No evidence of atrial fibrillation, frequent PACs (06/08/2022)  Risk Assessment/Calculations:            Physical Exam:   VS:  BP 104/72   Pulse (!) 52   Ht 6' (1.829 m)   Wt 180 lb 12.8 oz (82 kg)   SpO2 97%   BMI 24.52 kg/m    Wt Readings from Last 3 Encounters:  10/03/23 180 lb 12.8 oz (82 kg)  09/30/22 181 lb (82.1 kg)  04/08/15 187 lb (84.8 kg)    GEN: Well nourished, well developed in no acute distress NECK: No JVD; No carotid bruits CARDIAC: RRR, no murmurs, no rubs, no gallops RESPIRATORY:  Clear to auscultation without rales, wheezing or rhonchi  ABDOMEN: Soft, non-tender, non-distended EXTREMITIES:  No edema; No deformity   ASSESSMENT AND PLAN: .    Assessment and Plan    Mildly dilated aorta Ascending aorta measured at 3.9-4.1 cm on CT scan, stable from prior year. Discussed the measurement technique and the importance of consistent and accurate measurements. -Plan to alternate between echocardiogram and CT scan for annual surveillance. Next year ECHO.   Thyroid nodule Newly identified thyroid nodule on CT scan, not previously reported. -Refer to endocrinology for further evaluation.  Hypertension Recent episode of hypertensive urgency managed with increase in Lisinopril to 40mg  and addition of Amlodipine 2.5mg . -Continue current antihypertensive regimen as blood pressure is well controlled at current visit.  Hyperlipidemia LDL of 107 on Atorvastatin 10mg . -Continue Atorvastatin 10mg   daily.  Premature atrial contractions Noted on Zio monitor, no atrial fibrillation identified. -No change in management, continue monitoring.   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             Dispo: 1 yr  Signed, Donato Schultz, MD

## 2023-10-18 DIAGNOSIS — E041 Nontoxic single thyroid nodule: Secondary | ICD-10-CM | POA: Diagnosis not present

## 2023-11-25 ENCOUNTER — Other Ambulatory Visit: Payer: Self-pay | Admitting: Gastroenterology

## 2023-11-25 DIAGNOSIS — Z800 Family history of malignant neoplasm of digestive organs: Secondary | ICD-10-CM

## 2023-12-16 ENCOUNTER — Other Ambulatory Visit: Payer: Self-pay

## 2023-12-16 ENCOUNTER — Other Ambulatory Visit: Payer: Medicare Other

## 2023-12-16 DIAGNOSIS — E041 Nontoxic single thyroid nodule: Secondary | ICD-10-CM

## 2023-12-17 LAB — TSH: TSH: 0.64 m[IU]/L (ref 0.40–4.50)

## 2023-12-17 LAB — T4, FREE: Free T4: 1.3 ng/dL (ref 0.8–1.8)

## 2023-12-20 ENCOUNTER — Ambulatory Visit: Payer: Medicare Other | Admitting: "Endocrinology

## 2023-12-29 DIAGNOSIS — Z860101 Personal history of adenomatous and serrated colon polyps: Secondary | ICD-10-CM | POA: Diagnosis not present

## 2023-12-29 DIAGNOSIS — Z800 Family history of malignant neoplasm of digestive organs: Secondary | ICD-10-CM | POA: Diagnosis not present

## 2024-01-01 ENCOUNTER — Ambulatory Visit
Admission: RE | Admit: 2024-01-01 | Discharge: 2024-01-01 | Disposition: A | Discharge status: Home / Self Care | Payer: Medicare Other | Source: Ambulatory Visit | Attending: Gastroenterology | Admitting: Gastroenterology

## 2024-01-01 DIAGNOSIS — Z800 Family history of malignant neoplasm of digestive organs: Secondary | ICD-10-CM | POA: Diagnosis not present

## 2024-01-01 DIAGNOSIS — N281 Cyst of kidney, acquired: Secondary | ICD-10-CM | POA: Diagnosis not present

## 2024-01-01 MED ORDER — GADOPICLENOL 0.5 MMOL/ML IV SOLN
8.0000 mL | Freq: Once | INTRAVENOUS | Status: AC | PRN
  Administered 2024-01-01: 8 mL via INTRAVENOUS

## 2024-01-25 ENCOUNTER — Encounter: Payer: Self-pay | Admitting: "Endocrinology

## 2024-01-25 ENCOUNTER — Ambulatory Visit (INDEPENDENT_AMBULATORY_CARE_PROVIDER_SITE_OTHER): Payer: Medicare Other | Admitting: "Endocrinology

## 2024-01-25 VITALS — BP 110/60 | HR 70 | Resp 20 | Ht 72.0 in | Wt 187.6 lb

## 2024-01-25 DIAGNOSIS — E041 Nontoxic single thyroid nodule: Secondary | ICD-10-CM

## 2024-01-25 NOTE — Progress Notes (Signed)
 Outpatient Endocrinology Note Gerald Birmingham, MD  01/25/24   Gerald Lynch 11-Apr-1951 [id number]  Referring Provider: Jeffrie Oneil BROCKS, MD Primary Care Provider: Charlott Dorn LABOR, MD Subjective  No chief complaint on file.   Assessment & Plan  Diagnoses and all orders for this visit:  Thyroid  nodule -     US  THYROID ; Future    Joey Lierman has never been on any thyroid  medication. Patient is currently biochemically and clinically euthyroid.  Educated on thyroid  axis.  Repeat lab before next visit or sooner if symptoms of hyperthyroidism or hypothyroidism develop.  Notify us  immediately in case of significant weight gain or loss. Counseled on compliance and follow up needs.  Thyroid  nodule diagnosed in 2025 incidentally during CT angio 09/26/23 CT ANGIOGRAPHY CHEST WITH CONTRAST There is a heterogeneous nodule in the right lobe of the thyroid  gland measuring 3.7 cm. The trachea and esophagus are within normal limits. No compressive symptoms  Ordered thyroid  U/S, patient open to FNA if needed, explained FNA procedure    I have reviewed current medications, nurse's notes, allergies, vital signs, past medical and surgical history, family medical history, and social history for this encounter. Counseled patient on symptoms, examination findings, lab findings, imaging results, treatment decisions and monitoring and prognosis. The patient understood the recommendations and agrees with the treatment plan. All questions regarding treatment plan were fully answered.   Return in about 1 year (around 01/24/2025).   Gerald Birmingham, MD  01/25/24   I have reviewed current medications, nurse's notes, allergies, vital signs, past medical and surgical history, family medical history, and social history for this encounter. Counseled patient on symptoms, examination findings, lab findings, imaging results, treatment decisions and monitoring and prognosis. The patient understood the  recommendations and agrees with the treatment plan. All questions regarding treatment plan were fully answered.   History of Present Illness Gerald Lynch is a 73 y.o. year old male who presents to our clinic with thyroid  nodule diagnosed in 2025 incidentally during CT angio.    Symptoms suggestive of HYPOTHYROIDISM:  fatigue No weight gain No cold intolerance  No constipation  No  Symptoms suggestive of HYPERTHYROIDISM:  weight loss  No heat intolerance No hyperdefecation  No palpitations  No  Compressive symptoms:  dysphagia  No dysphonia  No positional dyspnea (especially with simultaneous arms elevation)  No  Smokes  No On biotin  No Personal history of head/neck surgery/irradiation  No  No self history of any cancer No family history of thyroid  cancer   09/26/23 CT ANGIOGRAPHY CHEST WITH CONTRAST There is a heterogeneous nodule in the right lobe of the thyroid  gland measuring 3.7 cm. The trachea and esophagus are within normal limits.  Component     Latest Ref Rng 12/16/2023  TSH     0.40 - 4.50 mIU/L 0.64   T4,Free(Direct)     0.8 - 1.8 ng/dL 1.3      Physical Exam  BP 110/60 (BP Location: Left Arm, Patient Position: Sitting, Cuff Size: Large)   Pulse 70   Resp 20   Ht 6' (1.829 m)   Wt 187 lb 9.6 oz (85.1 kg)   SpO2 99%   BMI 25.44 kg/m  Constitutional: well developed, well nourished Head: normocephalic, atraumatic, no exophthalmos Eyes: sclera anicteric, no redness Neck: no thyromegaly, no thyroid  tenderness; no nodules palpated Lungs: normal respiratory effort Neurology: alert and oriented, no fine hand tremor Skin: dry, no appreciable rashes Musculoskeletal: no appreciable defects Psychiatric: normal mood and affect  Allergies Allergies  Allergen Reactions   Food Rash    strawberries   Other Rash    strawberries    Current Medications Patient's Medications  New Prescriptions   No medications on file  Previous Medications   AMLODIPINE  (NORVASC) 2.5 MG TABLET    Take 2.5 mg by mouth daily.   ASPIRIN EC 81 MG TABLET    Take 81 mg by mouth daily.   ATORVASTATIN (LIPITOR) 10 MG TABLET    Take 10 mg by mouth daily.     BOOSTRIX 5-2.5-18.5 LF-MCG/0.5 INJECTION       FINASTERIDE (PROPECIA PO)    Take by mouth daily.     LISINOPRIL  (ZESTRIL ) 40 MG TABLET    Take 40 mg by mouth daily. Pt take 2 tablet once a day.   MELOXICAM  (MOBIC ) 15 MG TABLET    Take 1 tablet (15 mg total) by mouth daily. Eat with food.   METOPROLOL  TARTRATE (LOPRESSOR ) 50 MG TABLET    Take 1 tablet (50 mg total) by mouth as directed. Take 1 tablet 2 hours before the CT scan   MULTIPLE VITAMIN (ONE-A-DAY MENS PO)    Take 1 tablet by mouth daily.   POTASSIUM CHLORIDE (KLOR-CON) 10 MEQ TABLET    Take 10 mEq by mouth daily.   RA KRILL OIL 500 MG CAPS    Take 500 mg by mouth daily.  Modified Medications   No medications on file  Discontinued Medications   No medications on file    Past Medical History Past Medical History:  Diagnosis Date   Aortic atherosclerosis (HCC)    DDD (degenerative disc disease), cervical    Family history of pancreatic cancer    Family history of prostate cancer    History of adenomatous polyp of colon    Hypercalcemia    Hyperlipidemia    Hypertension    Kidney cysts    Paresthesia of left leg    Personal history of colonic polyps    Polyp of colon    Sciatica of left side    Seasonal allergic rhinitis due to pollen     Past Surgical History Past Surgical History:  Procedure Laterality Date   KNEE ARTHROSCOPY      Family History family history includes Hypertension in an other family member; Lung cancer (age of onset: 80) in his brother; Pancreatic cancer (age of onset: 58) in his brother; Pancreatic cancer (age of onset: 55) in his sister; Prostate cancer (age of onset: 31) in his brother.  Social History Social History   Socioeconomic History   Marital status: Married    Spouse name: Not on file   Number of  children: Not on file   Years of education: Not on file   Highest education level: Not on file  Occupational History   Not on file  Tobacco Use   Smoking status: Never   Smokeless tobacco: Not on file  Substance and Sexual Activity   Alcohol use: Yes    Comment: 1 drink daily   Drug use: No   Sexual activity: Not on file  Other Topics Concern   Not on file  Social History Narrative   Not on file   Social Drivers of Health   Financial Resource Strain: Not on file  Food Insecurity: Not on file  Transportation Needs: Not on file  Physical Activity: Not on file  Stress: Not on file  Social Connections: Not on file  Intimate Partner Violence: Not on file  Laboratory Investigations Lab Results  Component Value Date   TSH 0.64 12/16/2023   FREET4 1.3 12/16/2023     No results found for: TSI   No components found for: TRAB   No results found for: CHOL No results found for: HDL No results found for: LDLCALC No results found for: TRIG No results found for: [facility] Lab Results  Component Value Date   CREATININE 1.05 09/06/2023   No results found for: GFR    Component Value Date/Time   NA 139 09/06/2023 0135   NA 142 09/30/2022 1340   K 3.8 09/06/2023 0135   CL 104 09/06/2023 0135   CO2 28 09/06/2023 0135   GLUCOSE 138 (H) 09/06/2023 0135   BUN 15 09/06/2023 0135   BUN 13 09/30/2022 1340   CREATININE 1.05 09/06/2023 0135   CALCIUM 9.7 09/06/2023 0135   PROT 7.2 02/09/2015 0629   ALBUMIN 4.2 02/09/2015 0629   AST 28 02/09/2015 0629   ALT 23 02/09/2015 0629   ALKPHOS 64 02/09/2015 0629   BILITOT 0.6 02/09/2015 0629   GFRNONAA >60 09/06/2023 0135   GFRAA >90 02/09/2015 0629      Latest Ref Rng & Units 09/06/2023    1:35 AM 09/30/2022    1:40 PM 02/09/2015    6:29 AM  BMP  Glucose 70 - 99 mg/dL 861  82  879   BUN 8 - 23 mg/dL 15  13  11    Creatinine 0.61 - 1.24 mg/dL 8.94  9.02  9.04   BUN/Creat Ratio 10 - 24  13    Sodium 135 - 145 mmol/L  139  142  138   Potassium 3.5 - 5.1 mmol/L 3.8  4.5  3.8   Chloride 98 - 111 mmol/L 104  104  107   CO2 22 - 32 mmol/L 28  25  28    Calcium 8.9 - 10.3 mg/dL 9.7  89.5  9.2        Component Value Date/Time   WBC 10.9 (H) 09/06/2023 0135   RBC 4.75 09/06/2023 0135   HGB 14.5 09/06/2023 0135   HCT 44.7 09/06/2023 0135   PLT 235 09/06/2023 0135   MCV 94.1 09/06/2023 0135   MCH 30.5 09/06/2023 0135   MCHC 32.4 09/06/2023 0135   RDW 14.0 09/06/2023 0135   LYMPHSABS 0.6 (L) 02/09/2015 0629   MONOABS 0.5 02/09/2015 0629   EOSABS 0.0 02/09/2015 0629   BASOSABS 0.0 02/09/2015 0629      Parts of this note may have been dictated using voice recognition software. There may be variances in spelling and vocabulary which are unintentional. Not all errors are proofread. Please notify the dino if any discrepancies are noted or if the meaning of any statement is not clear.

## 2024-02-02 ENCOUNTER — Ambulatory Visit (HOSPITAL_COMMUNITY)
Admission: RE | Admit: 2024-02-02 | Discharge: 2024-02-02 | Disposition: A | Discharge status: Home / Self Care | Payer: Medicare Other | Source: Ambulatory Visit | Attending: "Endocrinology | Admitting: "Endocrinology

## 2024-02-02 DIAGNOSIS — E041 Nontoxic single thyroid nodule: Secondary | ICD-10-CM | POA: Insufficient documentation

## 2024-02-10 ENCOUNTER — Encounter: Payer: Self-pay | Admitting: "Endocrinology

## 2024-02-22 ENCOUNTER — Telehealth (INDEPENDENT_AMBULATORY_CARE_PROVIDER_SITE_OTHER): Admitting: "Endocrinology

## 2024-02-22 ENCOUNTER — Encounter: Payer: Self-pay | Admitting: "Endocrinology

## 2024-02-22 DIAGNOSIS — E041 Nontoxic single thyroid nodule: Secondary | ICD-10-CM

## 2024-02-22 NOTE — Telephone Encounter (Signed)
 Please advise on what the next step is for pt.

## 2024-02-22 NOTE — Progress Notes (Signed)
 The patient reports they are currently: Gerald Lynch. I spent 5-6 minutes on the video with the patient on the date of service. I spent an additional 10 minutes on pre- and post-visit activities on the date of service.   The patient was physically located in [place] or a state in which I am permitted to provide care. The patient and/or parent/guardian understood that s/he may incur co-pays and cost sharing, and agreed to the telemedicine visit. The visit was reasonable and appropriate under the circumstances given the patient's presentation at the time.  The patient and/or parent/guardian has been advised of the potential risks and limitations of this mode of treatment (including, but not limited to, the absence of in-person examination) and has agreed to be treated using telemedicine. The patient's/patient's family's questions regarding telemedicine have been answered.   The patient and/or parent/guardian has also been advised to contact their provider's office for worsening conditions, and seek emergency medical treatment and/or call 911 if the patient deems either necessary.     Outpatient Endocrinology Note Gerald Arthur, MD  02/22/24   Gerald Lynch [birth year] Lynch  Referring Provider: Emilio Aspen, * Primary Care Provider: Emilio Aspen, MD Subjective  No chief complaint on file.   Assessment & Plan  Diagnoses and all orders for this visit:  Uninodular goiter -     [place] FNA BX THYROID 1ST LESION AFIRMA; Future -     TSH(Reflex)   Gerald Lynch has never been on any thyroid medication. Patient is currently biochemically and clinically euthyroid.  Educated on thyroid axis.  Repeat lab before next visit or sooner if symptoms of hyperthyroidism or hypothyroidism develop.  Notify us immediately in case of significant weight gain or loss. Counseled on compliance and follow up needs.  Thyroid nodule diagnosed in 2025 incidentally during CT angio 09/26/23  CT ANGIOGRAPHY CHEST WITH CONTRAST There is a heterogeneous nodule in the right lobe of the thyroid gland measuring 3.7 cm. The trachea and esophagus are within normal limits. No compressive symptoms  Ordered thyroid U/S, reported goiter with right mid gland TR3 thyroid nodule measuring 3.4 x 2.8 x 3.8 cm-ordered FNA per patient's consent Will follow up result  I have reviewed current medications, nurse's notes, allergies, vital signs, past medical and surgical history, family medical history, and social history for this encounter. Counseled patient on symptoms, examination findings, lab findings, imaging results, treatment decisions and monitoring and prognosis. The patient understood the recommendations and agrees with the treatment plan. All questions regarding treatment plan were fully answered.   Return in about 1 year (around 02/21/2025) for visit + labs before next visit.   Gerald [place], MD  02/22/24   I have reviewed current medications, nurse's notes, allergies, vital signs, past medical and surgical history, family medical history, and social history for this encounter. Counseled patient on symptoms, examination findings, lab findings, imaging results, treatment decisions and monitoring and prognosis. The patient understood the recommendations and agrees with the treatment plan. All questions regarding treatment plan were fully answered.   History of Present Illness Gerald Lynch is a 73 y.o. year old male who presents to our clinic with thyroid nodule diagnosed in 2025 incidentally during CT angio.    Symptoms suggestive of HYPOTHYROIDISM:  fatigue No weight gain No cold intolerance  No constipation  No  Symptoms suggestive of HYPERTHYROIDISM:  weight loss  No heat intolerance No hyperdefecation  No palpitations  No  Compressive symptoms:  dysphagia  No dysphonia  No positional dyspnea (  especially with simultaneous arms elevation)  No  Smokes  No On biotin   No Personal history of head/neck surgery/irradiation  No  No self history of any cancer No family history of thyroid cancer   09/26/23 CT ANGIOGRAPHY CHEST WITH CONTRAST There is a heterogeneous nodule in the right lobe of the thyroid gland measuring 3.7 cm. The trachea and esophagus are within normal limits.  Component     Latest Ref Rng 12/16/2023  TSH     0.40 - 4.50 mIU/L 0.64   T4,Free(Direct)     0.8 - 1.8 ng/dL 1.3    2/95/62 THYROID ULTRASOUND   TECHNIQUE: Ultrasound examination of the thyroid gland and adjacent soft tissues was performed.   COMPARISON:  None Available.   FINDINGS: Parenchymal Echotexture: Normal   Isthmus: 0.4 cm   Right lobe: 6.3 x 3.2 x 3.1 cm   Left lobe: 3.9 x 2.0 x 1.8 cm   _________________________________________________________   Estimated total number of nodules >/= 1 cm: 1   Number of spongiform nodules >/=  2 cm not described below (TR1): 0   Number of mixed cystic and solid nodules >/= 1.5 cm not described below (TR2): 0   _________________________________________________________   Nodule # 1: Solid isoechoic nodule in the right mid gland measures 3.4 x 2.8 x 3.8 cm. No calcifications or other suspicious features. Findings are consistent with TI-RADS category 3. **Given size (>/= 2.5 cm) and appearance, fine needle aspiration of this mildly suspicious nodule should be considered based on TI-RADS criteria.   IMPRESSION: Solitary 3.4 cm TI-RADS category 3 nodule in the right mid gland meets criteria to consider fine-needle aspiration biopsy. Biopsy is recommended.    Physical Exam  There were no vitals taken for this visit. Constitutional: well developed, well nourished Head: normocephalic, atraumatic, no exophthalmos Eyes: sclera anicteric, no redness Neck: + thyromegaly, no thyroid tenderness; + right nodule  Lungs: normal respiratory effort Neurology: alert and oriented, no fine hand tremor Skin: dry, no appreciable  rashes Musculoskeletal: no appreciable defects Psychiatric: normal mood and affect  Allergies Allergies  Allergen Reactions   Food Rash    strawberries   Other Rash    strawberries    Current Medications Patient's Medications  New Prescriptions   No medications on file  Previous Medications   AMLODIPINE (NORVASC) 2.5 MG TABLET    Take 2.5 mg by mouth daily.   ASPIRIN EC 81 MG TABLET    Take 81 mg by mouth daily.   ATORVASTATIN (LIPITOR) 10 MG TABLET    Take 10 mg by mouth daily.     BOOSTRIX 5-2.5-18.5 LF-MCG/0.5 INJECTION       FINASTERIDE (PROPECIA PO)    Take by mouth daily.     LISINOPRIL (ZESTRIL) 40 MG TABLET    Take 40 mg by mouth daily. Pt take 2 tablet once a day.   MELOXICAM (MOBIC) 15 MG TABLET    Take 1 tablet (15 mg total) by mouth daily. Eat with food.   METOPROLOL TARTRATE (LOPRESSOR) 50 MG TABLET    Take 1 tablet (50 mg total) by mouth as directed. Take 1 tablet 2 hours before the CT scan   MULTIPLE VITAMIN (ONE-A-DAY MENS PO)    Take 1 tablet by mouth daily.   POTASSIUM CHLORIDE (KLOR-CON) 10 MEQ TABLET    Take 10 mEq by mouth daily.   RA KRILL OIL 500 MG CAPS    Take 500 mg by mouth daily.  Modified Medications   No  medications on file  Discontinued Medications   No medications on file    Past Medical History Past Medical History:  Diagnosis Date   Aortic atherosclerosis (HCC)    DDD (degenerative disc disease), cervical    Family history of pancreatic cancer    Family history of prostate cancer    History of adenomatous polyp of colon    Hypercalcemia    Hyperlipidemia    Hypertension    Kidney cysts    Paresthesia of left leg    Personal history of colonic polyps    Polyp of colon    Sciatica of left side    Seasonal allergic rhinitis due to pollen     Past Surgical History Past Surgical History:  Procedure Laterality Date   KNEE ARTHROSCOPY      Family History family history includes Hypertension in an other family member; Lung cancer  (age of onset: 54) in his brother; Pancreatic cancer (age of onset: 105) in his brother; Pancreatic cancer (age of onset: 59) in his sister; Prostate cancer (age of onset: 85) in his brother.  Social History Social History   Socioeconomic History   Marital status: Married    Spouse name: Not on file   Number of children: Not on file   Years of education: Not on file   Highest education level: Not on file  Occupational History   Not on file  Tobacco Use   Smoking status: Never   Smokeless tobacco: Not on file  Substance and Sexual Activity   Alcohol use: Yes    Comment: 1 drink daily   Drug use: No   Sexual activity: Not on file  Other Topics Concern   Not on file  Social History Narrative   Not on file   Social Drivers of Health   Financial Resource Strain: Not on file  Food Insecurity: Not on file  Transportation Needs: Not on file  Physical Activity: Not on file  Stress: Not on file  Social Connections: Not on file  Intimate Partner Violence: Not on file    Laboratory Investigations Lab Results  Component Value Date   TSH 0.64 12/16/2023   FREET4 1.3 12/16/2023     No results found for: "TSI"   No components found for: "TRAB"   No results found for: "CHOL" No results found for: "HDL" No results found for: "LDLCALC" No results found for: "TRIG" No results found for: "CHOLHDL" Lab Results  Component Value Date   CREATININE 1.05 09/06/2023   No results found for: "GFR"    Component Value Date/Time   NA 139 09/06/2023 0135   NA 142 09/30/2022 1340   K 3.8 09/06/2023 0135   CL 104 09/06/2023 0135   CO2 28 09/06/2023 0135   GLUCOSE 138 (H) 09/06/2023 0135   BUN 15 09/06/2023 0135   BUN 13 09/30/2022 1340   CREATININE 1.05 09/06/2023 0135   CALCIUM 9.7 09/06/2023 0135   PROT 7.2 02/09/2015 0629   ALBUMIN 4.2 02/09/2015 0629   AST 28 02/09/2015 0629   ALT 23 02/09/2015 0629   ALKPHOS 64 02/09/2015 0629   BILITOT 0.6 02/09/2015 0629   GFRNONAA >60  09/06/2023 0135   GFRAA >90 02/09/2015 0629      Latest Ref Rng & Units 09/06/2023    1:35 AM 09/30/2022    1:40 PM 02/09/2015    6:29 AM  BMP  Glucose 70 - 99 mg/dL 213  82  086   BUN 8 - 23 mg/dL 15  13  11   Creatinine 0.61 - 1.24 mg/dL 1.30  8.65  7.84   BUN/Creat Ratio 10 - 24  13    Sodium 135 - 145 mmol/L 139  142  138   Potassium 3.5 - 5.1 mmol/L 3.8  4.5  3.8   Chloride 98 - 111 mmol/L 104  104  107   CO2 22 - 32 mmol/L 28  25  28    Calcium 8.9 - 10.3 mg/dL 9.7  69.6  9.2        Component Value Date/Time   WBC 10.9 (H) 09/06/2023 0135   RBC 4.75 09/06/2023 0135   HGB 14.5 09/06/2023 0135   HCT 44.7 09/06/2023 0135   PLT 235 09/06/2023 0135   MCV 94.1 09/06/2023 0135   MCH 30.5 09/06/2023 0135   MCHC 32.4 09/06/2023 0135   RDW 14.0 09/06/2023 0135   LYMPHSABS 0.6 (L) 02/09/2015 0629   MONOABS 0.5 02/09/2015 0629   EOSABS 0.0 02/09/2015 0629   BASOSABS 0.0 02/09/2015 0629      Parts of this note may have been dictated using voice recognition software. There may be variances in spelling and vocabulary which are unintentional. Not all errors are proofread. Please notify the Thereasa Parkin if any discrepancies are noted or if the meaning of any statement is not clear.

## 2024-02-28 ENCOUNTER — Encounter: Payer: Self-pay | Admitting: "Endocrinology

## 2024-03-13 DIAGNOSIS — H2512 Age-related nuclear cataract, left eye: Secondary | ICD-10-CM | POA: Diagnosis not present

## 2024-03-13 DIAGNOSIS — H25812 Combined forms of age-related cataract, left eye: Secondary | ICD-10-CM | POA: Diagnosis not present

## 2024-03-13 DIAGNOSIS — Z961 Presence of intraocular lens: Secondary | ICD-10-CM | POA: Diagnosis not present

## 2024-03-19 DIAGNOSIS — E559 Vitamin D deficiency, unspecified: Secondary | ICD-10-CM | POA: Diagnosis not present

## 2024-03-19 DIAGNOSIS — I10 Essential (primary) hypertension: Secondary | ICD-10-CM | POA: Diagnosis not present

## 2024-03-19 DIAGNOSIS — Z79899 Other long term (current) drug therapy: Secondary | ICD-10-CM | POA: Diagnosis not present

## 2024-03-19 DIAGNOSIS — N529 Male erectile dysfunction, unspecified: Secondary | ICD-10-CM | POA: Diagnosis not present

## 2024-03-19 DIAGNOSIS — I77819 Aortic ectasia, unspecified site: Secondary | ICD-10-CM | POA: Diagnosis not present

## 2024-03-19 DIAGNOSIS — E041 Nontoxic single thyroid nodule: Secondary | ICD-10-CM | POA: Diagnosis not present

## 2024-03-19 DIAGNOSIS — Z1331 Encounter for screening for depression: Secondary | ICD-10-CM | POA: Diagnosis not present

## 2024-03-19 DIAGNOSIS — Z0000 Encounter for general adult medical examination without abnormal findings: Secondary | ICD-10-CM | POA: Diagnosis not present

## 2024-03-19 DIAGNOSIS — Z8042 Family history of malignant neoplasm of prostate: Secondary | ICD-10-CM | POA: Diagnosis not present

## 2024-03-19 DIAGNOSIS — I2510 Atherosclerotic heart disease of native coronary artery without angina pectoris: Secondary | ICD-10-CM | POA: Diagnosis not present

## 2024-03-19 DIAGNOSIS — R252 Cramp and spasm: Secondary | ICD-10-CM | POA: Diagnosis not present

## 2024-03-19 DIAGNOSIS — E7800 Pure hypercholesterolemia, unspecified: Secondary | ICD-10-CM | POA: Diagnosis not present

## 2024-03-27 DIAGNOSIS — Z961 Presence of intraocular lens: Secondary | ICD-10-CM | POA: Diagnosis not present

## 2024-03-27 DIAGNOSIS — H2511 Age-related nuclear cataract, right eye: Secondary | ICD-10-CM | POA: Diagnosis not present

## 2024-03-27 DIAGNOSIS — H25811 Combined forms of age-related cataract, right eye: Secondary | ICD-10-CM | POA: Diagnosis not present

## 2024-04-04 ENCOUNTER — Other Ambulatory Visit (HOSPITAL_COMMUNITY)
Admission: RE | Admit: 2024-04-04 | Discharge: 2024-04-04 | Disposition: A | Discharge status: Home / Self Care | Source: Ambulatory Visit | Attending: "Endocrinology | Admitting: "Endocrinology

## 2024-04-04 ENCOUNTER — Ambulatory Visit
Admission: RE | Admit: 2024-04-04 | Discharge: 2024-04-04 | Disposition: A | Discharge status: Home / Self Care | Source: Ambulatory Visit | Attending: "Endocrinology | Admitting: "Endocrinology

## 2024-04-04 DIAGNOSIS — E041 Nontoxic single thyroid nodule: Secondary | ICD-10-CM

## 2024-04-05 LAB — CYTOLOGY - NON PAP

## 2024-04-16 DIAGNOSIS — R55 Syncope and collapse: Secondary | ICD-10-CM | POA: Diagnosis not present

## 2024-04-16 DIAGNOSIS — R42 Dizziness and giddiness: Secondary | ICD-10-CM | POA: Diagnosis not present

## 2024-07-02 DIAGNOSIS — D2261 Melanocytic nevi of right upper limb, including shoulder: Secondary | ICD-10-CM | POA: Diagnosis not present

## 2024-07-02 DIAGNOSIS — D2239 Melanocytic nevi of other parts of face: Secondary | ICD-10-CM | POA: Diagnosis not present

## 2024-07-02 DIAGNOSIS — D2271 Melanocytic nevi of right lower limb, including hip: Secondary | ICD-10-CM | POA: Diagnosis not present

## 2024-07-02 DIAGNOSIS — D1801 Hemangioma of skin and subcutaneous tissue: Secondary | ICD-10-CM | POA: Diagnosis not present

## 2024-07-02 DIAGNOSIS — L814 Other melanin hyperpigmentation: Secondary | ICD-10-CM | POA: Diagnosis not present

## 2024-07-02 DIAGNOSIS — L720 Epidermal cyst: Secondary | ICD-10-CM | POA: Diagnosis not present

## 2024-07-02 DIAGNOSIS — D2262 Melanocytic nevi of left upper limb, including shoulder: Secondary | ICD-10-CM | POA: Diagnosis not present

## 2024-07-02 DIAGNOSIS — D224 Melanocytic nevi of scalp and neck: Secondary | ICD-10-CM | POA: Diagnosis not present

## 2024-07-02 DIAGNOSIS — L821 Other seborrheic keratosis: Secondary | ICD-10-CM | POA: Diagnosis not present

## 2024-07-02 DIAGNOSIS — D225 Melanocytic nevi of trunk: Secondary | ICD-10-CM | POA: Diagnosis not present

## 2024-09-02 DIAGNOSIS — Z23 Encounter for immunization: Secondary | ICD-10-CM | POA: Diagnosis not present

## 2024-09-21 ENCOUNTER — Ambulatory Visit (HOSPITAL_COMMUNITY)
Admission: RE | Admit: 2024-09-21 | Discharge: 2024-09-21 | Disposition: A | Discharge status: Home / Self Care | Source: Ambulatory Visit | Attending: Cardiology | Admitting: Cardiology

## 2024-09-21 DIAGNOSIS — I77810 Thoracic aortic ectasia: Secondary | ICD-10-CM | POA: Diagnosis present

## 2024-09-21 DIAGNOSIS — R9431 Abnormal electrocardiogram [ECG] [EKG]: Secondary | ICD-10-CM | POA: Insufficient documentation

## 2024-09-21 LAB — ECHOCARDIOGRAM COMPLETE
AR max vel: 1.96 cm2
AV Area VTI: 2 cm2
AV Area mean vel: 1.9 cm2
AV Mean grad: 10.8 mmHg
AV Peak grad: 18.7 mmHg
Ao pk vel: 2.16 m/s
Area-P 1/2: 3.41 cm2
S' Lateral: 2.5 cm

## 2024-09-24 ENCOUNTER — Ambulatory Visit: Payer: Self-pay | Admitting: Cardiology

## 2024-10-03 DIAGNOSIS — I10 Essential (primary) hypertension: Secondary | ICD-10-CM | POA: Diagnosis not present

## 2024-10-03 DIAGNOSIS — N529 Male erectile dysfunction, unspecified: Secondary | ICD-10-CM | POA: Diagnosis not present

## 2024-10-03 DIAGNOSIS — I2510 Atherosclerotic heart disease of native coronary artery without angina pectoris: Secondary | ICD-10-CM | POA: Diagnosis not present

## 2024-10-03 DIAGNOSIS — E041 Nontoxic single thyroid nodule: Secondary | ICD-10-CM | POA: Diagnosis not present

## 2024-10-05 DIAGNOSIS — I10 Essential (primary) hypertension: Secondary | ICD-10-CM | POA: Diagnosis not present

## 2024-11-26 ENCOUNTER — Other Ambulatory Visit: Payer: Self-pay | Admitting: Gastroenterology

## 2024-11-26 DIAGNOSIS — Z800 Family history of malignant neoplasm of digestive organs: Secondary | ICD-10-CM

## 2024-12-03 DIAGNOSIS — M545 Low back pain, unspecified: Secondary | ICD-10-CM | POA: Diagnosis not present

## 2024-12-03 DIAGNOSIS — R109 Unspecified abdominal pain: Secondary | ICD-10-CM | POA: Diagnosis not present

## 2024-12-24 ENCOUNTER — Ambulatory Visit: Attending: Cardiology | Admitting: Cardiology

## 2024-12-24 ENCOUNTER — Encounter: Payer: Self-pay | Admitting: Cardiology

## 2024-12-24 VITALS — BP 116/78 | HR 64 | Ht 72.0 in | Wt 194.2 lb

## 2024-12-24 DIAGNOSIS — E041 Nontoxic single thyroid nodule: Secondary | ICD-10-CM | POA: Diagnosis not present

## 2024-12-24 DIAGNOSIS — R9431 Abnormal electrocardiogram [ECG] [EKG]: Secondary | ICD-10-CM | POA: Insufficient documentation

## 2024-12-24 DIAGNOSIS — I77810 Thoracic aortic ectasia: Secondary | ICD-10-CM | POA: Insufficient documentation

## 2024-12-24 NOTE — Patient Instructions (Signed)
 Medication Instructions:  The current medical regimen is effective;  continue present plan and medications.  *If you need a refill on your cardiac medications before your next appointment, please call your pharmacy*  Testing/Procedures: Your physician has requested that you have an echocardiogram (due 10/26). Echocardiography is a painless test that uses sound waves to create images of your heart. It provides your doctor with information about the size and shape of your heart and how well your hearts chambers and valves are working. This procedure takes approximately one hour. There are no restrictions for this procedure. Please do NOT wear cologne, perfume, aftershave, or lotions (deodorant is allowed). Please arrive 15 minutes prior to your appointment time.  Please note: We ask at that you not bring children with you during ultrasound (echo/ vascular) testing. Due to room size and safety concerns, children are not allowed in the ultrasound rooms during exams. Our front office staff cannot provide observation of children in our lobby area while testing is being conducted. An adult accompanying a patient to their appointment will only be allowed in the ultrasound room at the discretion of the ultrasound technician under special circumstances. We apologize for any inconvenience.   Follow-Up: At [facility], you and your health needs are our priority.  As part of our continuing mission to provide you with exceptional heart care, our providers are all part of one team.  This team includes your primary Cardiologist (physician) and Advanced Practice Providers or APPs (Physician Assistants and Nurse Practitioners) who all work together to provide you with the care you need, when you need it.  Your next appointment:   1 year(s)  Provider:   Dr Oneil Parchment    We recommend signing up for the patient portal called MyChart.  Sign up information is provided on this After Visit Summary.   MyChart is used to connect with patients for Virtual Visits (Telemedicine).  Patients are able to view lab/test results, encounter notes, upcoming appointments, etc.  Non-urgent messages can be sent to your provider as well.   To learn more about what you can do with MyChart, go to [email or web address].

## 2024-12-24 NOTE — Progress Notes (Signed)
 " Cardiology Office Note:  .   Date:  12/24/2024  ID:  Cai Anfinson, DOB 21-Sep-1951, MRN [id number] PCP: Charlott Dorn LABOR, MD  [facility] Providers Cardiologist:  None     History of Present Illness: SABRA   Mohamad Bruso is a 74 y.o. male Discussed the use of AI scribe   History of Present Illness Gerald Lynch is a 73 year old male with aortic valve calcification and mild regurgitation who presents for follow-up of aortic dilation.  Previous imaging studies, including an echocardiogram from October 2023, showed an aortic valve with moderate calcification and mild regurgitation, and an ascending aorta measuring 42 millimeters. A CT scan of the chest from October 2024 indicated a dilated aorta measuring 4.4 centimeters. A coronary CT scan from October 2023 showed an aorta measuring 42 millimeters with a calcium score of approximately three and minimal proximal RCA stenosis. In the double oblique position, the aorta measured 3.9 by 4.0 centimeters, indicating mild dilation.  He experiences sciatic back pain for which his primary care physician prescribed tizanidine 4 mg, a muscle relaxant. He is unsure if this medication affects his heart's electrical activity.  He is currently on atorvastatin 10 mg for cholesterol management and lisinopril  40 mg for blood pressure control. He previously used amlodipine 2.5 mg but has stopped it as needed due to low blood pressure readings. His LDL cholesterol was 83 in [id number], and his blood pressure is well-controlled with the current regimen.  He maintains an active lifestyle, walking a couple of miles daily, and tries to eat well, although he finds it challenging during the holidays. No new symptoms and he feels well overall.      Studies Reviewed: SABRA   EKG Interpretation Date/Time:  Monday December 24 2024 09:31:23 EST Ventricular Rate:  64 PR Interval:  244 QRS Duration:  108 QT Interval:  388 QTC Calculation: 400 R Axis:   -39  Text  Interpretation: Sinus rhythm with 1st degree A-V block Left axis deviation Incomplete right bundle branch block Possible Anteroseptal infarct (cited on or before 06-Sep-2023) When compared with ECG of 06-Sep-2023 01:12, No significant change was found Confirmed by Jeffrie Anes ([id number]) on 12/24/2024 9:48:52 AM    Results Labs LDL (2025): 83  Radiology Chest CT (09/2023): Ascending aorta diameter 4.4 cm, mild dilation Coronary CT (09/2022): Ascending aorta diameter 42 mm, coronary artery calcium score approximately 3, minimal proximal right coronary artery stenosis; in double oblique position, aorta measured 3.9 x 4.0 cm, mild dilation  Diagnostic Echocardiogram (09/2024): Ejection fraction 65%, normal left ventricular systolic function, trivial mitral regurgitation, moderate aortic valve calcification, mild aortic regurgitation, ascending aorta 42 mm, stable compared to prior studies EKG: No abnormalities, unchanged compared to prior studies (Independently interpreted) Risk Assessment/Calculations:            Physical Exam:   VS:  BP 116/78 (BP Location: Left Arm, Patient Position: Sitting)   Pulse 64   Ht 6' (1.829 m)   Wt 194 lb 3.2 oz (88.1 kg)   SpO2 98%   BMI 26.34 kg/m    Wt Readings from Last 3 Encounters:  12/24/24 194 lb 3.2 oz (88.1 kg)  01/25/24 187 lb 9.6 oz (85.1 kg)  10/03/23 180 lb 12.8 oz (82 kg)    GEN: Well nourished, well developed in no acute distress NECK: No JVD; No carotid bruits CARDIAC: RRR, no murmurs, no rubs, no gallops RESPIRATORY:  Clear to auscultation without rales, wheezing or rhonchi  ABDOMEN: Soft, non-tender, non-distended  EXTREMITIES:  No edema; No deformity   ASSESSMENT AND PLAN: .    Assessment and Plan Assessment & Plan Aneurysmal dilatation of the ascending aorta The ascending aorta measures between 40 to 44 mm, with most measurements around 42 mm. The condition is well-managed with no significant change in size. The risk of dissection  or rupture is low due to the current size. CT scan is considered the most accurate measurement, but echocardiogram is used for monitoring due to less radiation exposure. - Continue annual monitoring with echocardiogram.  Nonrheumatic aortic valve disease Moderate calcification of the aortic valve with mild aortic valve regurgitation. The condition is mild and does not currently require intervention. - Continue monitoring with echocardiogram.  Hypertension Blood pressure is well-controlled with current medication regimen. Lisinopril  40 mg is effective, and amlodipine is used as needed due to previous low blood pressure readings. - Continue lisinopril  40 mg daily. - Use amlodipine 2.5 mg as needed.  Hyperlipidemia LDL cholesterol is well-controlled at 83 mg/dL with atorvastatin 10 mg daily. - Continue atorvastatin 20 mg daily.  Premature atrial contractions EKG shows no significant changes compared to previous readings, indicating stable cardiac rhythm. - Continue current management and monitoring.         Dispo: 1 yr  Signed, Oneil Parchment, MD  "

## 2024-12-27 ENCOUNTER — Ambulatory Visit
Admission: RE | Admit: 2024-12-27 | Discharge: 2024-12-27 | Disposition: A | Discharge status: Home / Self Care | Source: Ambulatory Visit | Attending: Gastroenterology | Admitting: Gastroenterology

## 2024-12-27 DIAGNOSIS — Z800 Family history of malignant neoplasm of digestive organs: Secondary | ICD-10-CM

## 2024-12-27 MED ORDER — GADOPICLENOL 0.5 MMOL/ML IV SOLN
9.0000 mL | Freq: Once | INTRAVENOUS | Status: AC | PRN
  Administered 2024-12-27: 9 mL via INTRAVENOUS

## 2025-01-24 ENCOUNTER — Other Ambulatory Visit

## 2025-01-24 ENCOUNTER — Encounter: Payer: Self-pay | Admitting: "Endocrinology

## 2025-01-24 ENCOUNTER — Ambulatory Visit: Payer: Medicare Other | Admitting: "Endocrinology

## 2025-01-24 VITALS — BP 130/80 | HR 75 | Ht 72.0 in | Wt 196.0 lb

## 2025-01-24 DIAGNOSIS — E041 Nontoxic single thyroid nodule: Secondary | ICD-10-CM

## 2025-01-24 NOTE — Progress Notes (Signed)
 "    Outpatient Endocrinology Note Gerald Birmingham, MD  01/24/25   Gerald Lynch 75/19/1952 [id number]  Referring Provider: Charlott Dorn LABOR, MD Primary Care Provider: Charlott Dorn LABOR, MD Subjective  Chief Complaint  Patient presents with   Thyroid  Problem    Assessment & Plan  Gerald Lynch was seen today for thyroid  problem.  Diagnoses and all orders for this visit:  Uninodular goiter -     US  THYROID ; Future -     US  THYROID  -     TSH + free T4    Tison Leibold has never been on any thyroid  medication. Patient is currently biochemically and clinically euthyroid.  Educated on thyroid  axis.  Repeat lab before next visit or sooner if symptoms of hyperthyroidism or hypothyroidism develop.  Notify us  immediately in case of significant weight gain or loss. Counseled on compliance and follow up needs.  Thyroid  nodule diagnosed in 2025 incidentally during CT angio 09/26/23 CT ANGIOGRAPHY CHEST WITH CONTRAST There is a heterogeneous nodule in the right lobe of the thyroid  gland measuring 3.7 cm. The trachea and esophagus are within normal limits. No compressive symptoms  Ordered thyroid  U/S, reported goiter with right mid gland TR3 thyroid  nodule measuring 3.4 x 2.8 x 3.8 cm-ordered FNA per patient's consent 04/04/24:  Nodule #1: Solid isoechoic nodule in the right mid gland measures 3.4 x 2.8 x 3.8 cm: Benign follicular nodule (Bethesda category II)  Ordered follow thyroid  U/S and labs  I have reviewed current medications, nurse's notes, allergies, vital signs, past medical and surgical history, family medical history, and social history for this encounter. Counseled patient on symptoms, examination findings, lab findings, imaging results, treatment decisions and monitoring and prognosis. The patient understood the recommendations and agrees with the treatment plan. All questions regarding treatment plan were fully answered.   Return in about 29 days (around  02/22/2025).   Gerald Birmingham, MD  01/24/25   I have reviewed current medications, nurse's notes, allergies, vital signs, past medical and surgical history, family medical history, and social history for this encounter. Counseled patient on symptoms, examination findings, lab findings, imaging results, treatment decisions and monitoring and prognosis. The patient understood the recommendations and agrees with the treatment plan. All questions regarding treatment plan were fully answered.   History of Present Illness Gerald Lynch is a 74 y.o. year old male who presents to our clinic with thyroid  nodule diagnosed in 2025 incidentally during CT angio.    Symptoms suggestive of HYPOTHYROIDISM:  fatigue No weight gain No cold intolerance  No constipation  No  Symptoms suggestive of HYPERTHYROIDISM:  weight loss  No heat intolerance No hyperdefecation  No palpitations  No  Compressive symptoms:  dysphagia  No dysphonia  No positional dyspnea (especially with simultaneous arms elevation)  No  Smokes  No On biotin  No Personal history of head/neck surgery/irradiation  No  No self history of any cancer No family history of thyroid  cancer   09/26/23 CT ANGIOGRAPHY CHEST WITH CONTRAST There is a heterogeneous nodule in the right lobe of the thyroid  gland measuring 3.7 cm. The trachea and esophagus are within normal limits.  Component     Latest Ref Rng 12/16/2023  TSH     0.40 - 4.50 mIU/L 0.64   T4,Free(Direct)     0.8 - 1.8 ng/dL 1.3    7/86/74 THYROID  ULTRASOUND   TECHNIQUE: Ultrasound examination of the thyroid  gland and adjacent soft tissues was performed.   COMPARISON:  None Available.   FINDINGS: Parenchymal  Echotexture: Normal   Isthmus: 0.4 cm   Right lobe: 6.3 x 3.2 x 3.1 cm   Left lobe: 3.9 x 2.0 x 1.8 cm   _________________________________________________________   Estimated total number of nodules >/= 1 cm: 1   Number of spongiform nodules >/=  2 cm  not described below (TR1): 0   Number of mixed cystic and solid nodules >/= 1.5 cm not described below (TR2): 0   _________________________________________________________   Nodule # 1: Solid isoechoic nodule in the right mid gland measures 3.4 x 2.8 x 3.8 cm. No calcifications or other suspicious features. Findings are consistent with TI-RADS category 3. **Given size (>/= 2.5 cm) and appearance, fine needle aspiration of this mildly suspicious nodule should be considered based on TI-RADS criteria.   IMPRESSION: Solitary 3.4 cm TI-RADS category 3 nodule in the right mid gland meets criteria to consider fine-needle aspiration biopsy. Biopsy is recommended.    Physical Exam  BP 130/80   Pulse 75   Ht 6' (1.829 m)   Wt 196 lb (88.9 kg)   SpO2 96%   BMI 26.58 kg/m  Constitutional: well developed, well nourished Head: normocephalic, atraumatic, no exophthalmos Eyes: sclera anicteric, no redness Neck: + thyromegaly, no thyroid  tenderness; + right nodule  Lungs: normal respiratory effort Neurology: alert and oriented, no fine hand tremor Skin: dry, no appreciable rashes Musculoskeletal: no appreciable defects Psychiatric: normal mood and affect  Allergies Allergies  Allergen Reactions   Food Rash    strawberries   Other Rash    strawberries    Current Medications Patient's Medications  New Prescriptions   No medications on file  Previous Medications   AMLODIPINE (NORVASC) 2.5 MG TABLET    Take 2.5 mg by mouth daily.   ASPIRIN EC 81 MG TABLET    Take 81 mg by mouth daily.   ATORVASTATIN (LIPITOR) 10 MG TABLET    Take 10 mg by mouth daily.     BOOSTRIX 5-2.5-18.5 LF-MCG/0.5 INJECTION       FINASTERIDE (PROPECIA PO)    Take by mouth daily.     LISINOPRIL  (ZESTRIL ) 40 MG TABLET    Take 40 mg by mouth daily. Pt take 2 tablet once a day.   MELOXICAM  (MOBIC ) 15 MG TABLET    Take 1 tablet (15 mg total) by mouth daily. Eat with food.   METOPROLOL  TARTRATE (LOPRESSOR ) 50 MG  TABLET    Take 1 tablet (50 mg total) by mouth as directed. Take 1 tablet 2 hours before the CT scan   MULTIPLE VITAMIN (ONE-A-DAY MENS PO)    Take 1 tablet by mouth daily.   POTASSIUM CHLORIDE (KLOR-CON) 10 MEQ TABLET    Take 10 mEq by mouth daily.   RA KRILL OIL 500 MG CAPS    Take 500 mg by mouth daily.   TIZANIDINE (ZANAFLEX) 4 MG TABLET    Take 4 mg by mouth 3 (three) times daily as needed.  Modified Medications   No medications on file  Discontinued Medications   No medications on file    Past Medical History Past Medical History:  Diagnosis Date   Aortic atherosclerosis    DDD (degenerative disc disease), cervical    Family history of pancreatic cancer    Family history of prostate cancer    History of adenomatous polyp of colon    Hypercalcemia    Hyperlipidemia    Hypertension    Kidney cysts    Paresthesia of left leg    Personal  history of colonic polyps    Polyp of colon    Sciatica of left side    Seasonal allergic rhinitis due to pollen     Past Surgical History Past Surgical History:  Procedure Laterality Date   KNEE ARTHROSCOPY      Family History family history includes Hypertension in an other family member; Lung cancer (age of onset: 64) in his brother; Pancreatic cancer (age of onset: 23) in his brother; Pancreatic cancer (age of onset: 21) in his sister; Prostate cancer (age of onset: 17) in his brother.  Social History Social History   Socioeconomic History   Marital status: Married    Spouse name: Not on file   Number of children: Not on file   Years of education: Not on file   Highest education level: Not on file  Occupational History   Not on file  Tobacco Use   Smoking status: Never   Smokeless tobacco: Not on file  Substance and Sexual Activity   Alcohol use: Yes    Comment: 1 drink daily   Drug use: No   Sexual activity: Not on file  Other Topics Concern   Not on file  Social History Narrative   Not on file   Social Drivers of  Health   Tobacco Use: Unknown (01/24/2025)   Patient History    Smoking Tobacco Use: Never    Smokeless Tobacco Use: Unknown    Passive Exposure: Not on file  Financial Resource Strain: Not on file  Food Insecurity: Not on file  Transportation Needs: Not on file  Physical Activity: Not on file  Stress: Not on file  Social Connections: Not on file  Intimate Partner Violence: Not on file  Depression (EYV7-0): Not on file  Alcohol Screen: Not on file  Housing: Not on file  Utilities: Not on file  Health Literacy: Not on file    Laboratory Investigations Lab Results  Component Value Date   TSH 0.64 12/16/2023   FREET4 1.3 12/16/2023     No results found for: TSI   No components found for: TRAB   No results found for: CHOL No results found for: HDL No results found for: LDLCALC No results found for: TRIG No results found for: [facility] Lab Results  Component Value Date   CREATININE 1.05 09/06/2023   No results found for: GFR    Component Value Date/Time   NA 139 09/06/2023 0135   NA 142 09/30/2022 1340   K 3.8 09/06/2023 0135   CL 104 09/06/2023 0135   CO2 28 09/06/2023 0135   GLUCOSE 138 (H) 09/06/2023 0135   BUN 15 09/06/2023 0135   BUN 13 09/30/2022 1340   CREATININE 1.05 09/06/2023 0135   CALCIUM 9.7 09/06/2023 0135   PROT 7.2 02/09/2015 0629   ALBUMIN 4.2 02/09/2015 0629   AST 28 02/09/2015 0629   ALT 23 02/09/2015 0629   ALKPHOS 64 02/09/2015 0629   BILITOT 0.6 02/09/2015 0629   GFRNONAA >60 09/06/2023 0135   GFRAA >90 02/09/2015 0629      Latest Ref Rng & Units 09/06/2023    1:35 AM 09/30/2022    1:40 PM 02/09/2015    6:29 AM  BMP  Glucose 70 - 99 mg/dL 861  82  879   BUN 8 - 23 mg/dL 15  13  11    Creatinine 0.61 - 1.24 mg/dL 8.94  9.02  9.04   BUN/Creat Ratio 10 - 24  13    Sodium 135 - 145  mmol/L 139  142  138   Potassium 3.5 - 5.1 mmol/L 3.8  4.5  3.8   Chloride 98 - 111 mmol/L 104  104  107   CO2 22 - 32 mmol/L 28  25  28     Calcium 8.9 - 10.3 mg/dL 9.7  89.5  9.2        Component Value Date/Time   WBC 10.9 (H) 09/06/2023 0135   RBC 4.75 09/06/2023 0135   HGB 14.5 09/06/2023 0135   HCT 44.7 09/06/2023 0135   PLT 235 09/06/2023 0135   MCV 94.1 09/06/2023 0135   MCH 30.5 09/06/2023 0135   MCHC 32.4 09/06/2023 0135   RDW 14.0 09/06/2023 0135   LYMPHSABS 0.6 (L) 02/09/2015 0629   MONOABS 0.5 02/09/2015 0629   EOSABS 0.0 02/09/2015 0629   BASOSABS 0.0 02/09/2015 0629      Parts of this note may have been dictated using voice recognition software. There may be variances in spelling and vocabulary which are unintentional. Not all errors are proofread. Please notify the dino if any discrepancies are noted or if the meaning of any statement is not clear.    "

## 2025-01-25 LAB — TSH+FREE T4: TSH W/REFLEX TO FT4: 0.57 m[IU]/L (ref 0.40–4.50)

## 2025-02-22 ENCOUNTER — Telehealth: Admitting: "Endocrinology

## 2025-09-24 ENCOUNTER — Ambulatory Visit (HOSPITAL_COMMUNITY)
